# Patient Record
Sex: Female | Born: 1940 | Race: White | Hispanic: No | Smoking: Former smoker
Health system: Southern US, Community
[De-identification: ages and names within clinical notes are randomized; demographics above are authoritative.]

## PROBLEM LIST (undated history)

## (undated) DIAGNOSIS — M1990 Unspecified osteoarthritis, unspecified site: Secondary | ICD-10-CM

## (undated) DIAGNOSIS — Z5189 Encounter for other specified aftercare: Secondary | ICD-10-CM

## (undated) DIAGNOSIS — J439 Emphysema, unspecified: Secondary | ICD-10-CM

## (undated) DIAGNOSIS — N6019 Diffuse cystic mastopathy of unspecified breast: Secondary | ICD-10-CM

## (undated) DIAGNOSIS — C44310 Basal cell carcinoma of skin of unspecified parts of face: Secondary | ICD-10-CM

## (undated) DIAGNOSIS — M489 Spondylopathy, unspecified: Secondary | ICD-10-CM

## (undated) DIAGNOSIS — J449 Chronic obstructive pulmonary disease, unspecified: Secondary | ICD-10-CM

## (undated) DIAGNOSIS — R569 Unspecified convulsions: Secondary | ICD-10-CM

## (undated) DIAGNOSIS — Z720 Tobacco use: Secondary | ICD-10-CM

## (undated) DIAGNOSIS — K219 Gastro-esophageal reflux disease without esophagitis: Secondary | ICD-10-CM

## (undated) DIAGNOSIS — K2270 Barrett's esophagus without dysplasia: Secondary | ICD-10-CM

## (undated) DIAGNOSIS — I10 Essential (primary) hypertension: Secondary | ICD-10-CM

## (undated) DIAGNOSIS — E785 Hyperlipidemia, unspecified: Secondary | ICD-10-CM

## (undated) DIAGNOSIS — H269 Unspecified cataract: Secondary | ICD-10-CM

## (undated) HISTORY — PX: POLYPECTOMY: SHX149

## (undated) HISTORY — DX: Unspecified convulsions: R56.9

## (undated) HISTORY — DX: Emphysema, unspecified: J43.9

## (undated) HISTORY — PX: OTHER SURGICAL HISTORY: SHX169

## (undated) HISTORY — DX: Diffuse cystic mastopathy of unspecified breast: N60.19

## (undated) HISTORY — PX: KNEE ARTHROSCOPY: SUR90

## (undated) HISTORY — DX: Essential (primary) hypertension: I10

## (undated) HISTORY — DX: Hyperlipidemia, unspecified: E78.5

## (undated) HISTORY — DX: Unspecified cataract: H26.9

## (undated) HISTORY — DX: Unspecified osteoarthritis, unspecified site: M19.90

## (undated) HISTORY — DX: Chronic obstructive pulmonary disease, unspecified: J44.9

## (undated) HISTORY — PX: COLONOSCOPY: SHX174

## (undated) HISTORY — DX: Basal cell carcinoma of skin of unspecified parts of face: C44.310

## (undated) HISTORY — DX: Spondylopathy, unspecified: M48.9

## (undated) HISTORY — DX: Barrett's esophagus without dysplasia: K22.70

## (undated) HISTORY — DX: Tobacco use: Z72.0

## (undated) HISTORY — DX: Encounter for other specified aftercare: Z51.89

## (undated) HISTORY — DX: Gastro-esophageal reflux disease without esophagitis: K21.9

---
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
## 1965-09-08 DIAGNOSIS — Z5189 Encounter for other specified aftercare: Secondary | ICD-10-CM

## 1965-09-08 HISTORY — DX: Encounter for other specified aftercare: Z51.89

## 1969-09-08 DIAGNOSIS — N6019 Diffuse cystic mastopathy of unspecified breast: Secondary | ICD-10-CM

## 1969-09-08 HISTORY — DX: Diffuse cystic mastopathy of unspecified breast: N60.19

## 1970-09-08 HISTORY — PX: BREAST BIOPSY: SHX20

## 1998-03-16 ENCOUNTER — Other Ambulatory Visit: Admission: RE | Admit: 1998-03-16 | Discharge: 1998-03-16 | Payer: Self-pay | Admitting: Cardiology

## 1998-11-12 ENCOUNTER — Encounter: Admission: RE | Admit: 1998-11-12 | Discharge: 1998-12-07 | Payer: Self-pay | Admitting: Cardiology

## 2000-11-20 ENCOUNTER — Other Ambulatory Visit: Admission: RE | Admit: 2000-11-20 | Discharge: 2000-11-20 | Payer: Self-pay | Admitting: Internal Medicine

## 2001-07-27 ENCOUNTER — Emergency Department (HOSPITAL_COMMUNITY): Admission: EM | Admit: 2001-07-27 | Discharge: 2001-07-28 | Payer: Self-pay | Admitting: Emergency Medicine

## 2003-02-15 ENCOUNTER — Ambulatory Visit (HOSPITAL_COMMUNITY): Admission: RE | Admit: 2003-02-15 | Discharge: 2003-02-15 | Payer: Self-pay | Admitting: *Deleted

## 2003-02-15 ENCOUNTER — Encounter (INDEPENDENT_AMBULATORY_CARE_PROVIDER_SITE_OTHER): Payer: Self-pay | Admitting: Specialist

## 2003-02-15 ENCOUNTER — Encounter (INDEPENDENT_AMBULATORY_CARE_PROVIDER_SITE_OTHER): Payer: Self-pay | Admitting: *Deleted

## 2004-04-17 ENCOUNTER — Emergency Department (HOSPITAL_COMMUNITY): Admission: EM | Admit: 2004-04-17 | Discharge: 2004-04-17 | Payer: Self-pay | Admitting: *Deleted

## 2004-06-09 ENCOUNTER — Emergency Department (HOSPITAL_COMMUNITY): Admission: EM | Admit: 2004-06-09 | Discharge: 2004-06-09 | Payer: Self-pay | Admitting: Emergency Medicine

## 2005-01-15 ENCOUNTER — Encounter (INDEPENDENT_AMBULATORY_CARE_PROVIDER_SITE_OTHER): Payer: Self-pay | Admitting: *Deleted

## 2005-01-15 ENCOUNTER — Ambulatory Visit (HOSPITAL_COMMUNITY): Admission: RE | Admit: 2005-01-15 | Discharge: 2005-01-15 | Payer: Self-pay | Admitting: *Deleted

## 2005-01-15 ENCOUNTER — Encounter (INDEPENDENT_AMBULATORY_CARE_PROVIDER_SITE_OTHER): Payer: Self-pay | Admitting: Specialist

## 2005-08-05 ENCOUNTER — Inpatient Hospital Stay (HOSPITAL_COMMUNITY): Admission: EM | Admit: 2005-08-05 | Discharge: 2005-08-06 | Payer: Self-pay | Admitting: Emergency Medicine

## 2005-09-03 ENCOUNTER — Ambulatory Visit (HOSPITAL_COMMUNITY): Admission: RE | Admit: 2005-09-03 | Discharge: 2005-09-03 | Payer: Self-pay | Admitting: Internal Medicine

## 2006-04-20 ENCOUNTER — Encounter (INDEPENDENT_AMBULATORY_CARE_PROVIDER_SITE_OTHER): Payer: Self-pay | Admitting: Specialist

## 2006-04-20 ENCOUNTER — Encounter: Admission: RE | Admit: 2006-04-20 | Discharge: 2006-04-20 | Payer: Self-pay | Admitting: Internal Medicine

## 2006-04-20 ENCOUNTER — Other Ambulatory Visit: Admission: RE | Admit: 2006-04-20 | Discharge: 2006-04-20 | Payer: Self-pay | Admitting: Diagnostic Radiology

## 2007-01-12 ENCOUNTER — Ambulatory Visit (HOSPITAL_COMMUNITY): Admission: RE | Admit: 2007-01-12 | Discharge: 2007-01-12 | Payer: Self-pay | Admitting: Internal Medicine

## 2007-06-18 ENCOUNTER — Emergency Department (HOSPITAL_COMMUNITY): Admission: EM | Admit: 2007-06-18 | Discharge: 2007-06-18 | Payer: Self-pay | Admitting: Family Medicine

## 2007-12-23 ENCOUNTER — Encounter: Payer: Self-pay | Admitting: Pulmonary Disease

## 2007-12-30 ENCOUNTER — Encounter: Payer: Self-pay | Admitting: Pulmonary Disease

## 2008-07-18 ENCOUNTER — Encounter (INDEPENDENT_AMBULATORY_CARE_PROVIDER_SITE_OTHER): Payer: Self-pay | Admitting: *Deleted

## 2008-07-18 ENCOUNTER — Ambulatory Visit: Payer: Self-pay | Admitting: Pulmonary Disease

## 2008-07-18 DIAGNOSIS — J441 Chronic obstructive pulmonary disease with (acute) exacerbation: Secondary | ICD-10-CM | POA: Insufficient documentation

## 2008-08-08 ENCOUNTER — Encounter (HOSPITAL_COMMUNITY): Admission: RE | Admit: 2008-08-08 | Discharge: 2008-09-07 | Payer: Self-pay | Admitting: Pulmonary Disease

## 2008-09-08 ENCOUNTER — Encounter (HOSPITAL_COMMUNITY): Admission: RE | Admit: 2008-09-08 | Discharge: 2008-11-08 | Payer: Self-pay | Admitting: Pulmonary Disease

## 2008-10-02 ENCOUNTER — Ambulatory Visit: Payer: Self-pay | Admitting: Internal Medicine

## 2008-10-26 ENCOUNTER — Encounter: Payer: Self-pay | Admitting: Pulmonary Disease

## 2008-10-27 ENCOUNTER — Encounter: Payer: Self-pay | Admitting: Pulmonary Disease

## 2008-11-09 ENCOUNTER — Encounter (HOSPITAL_COMMUNITY): Admission: RE | Admit: 2008-11-09 | Discharge: 2009-02-07 | Payer: Self-pay | Admitting: Pulmonary Disease

## 2008-11-21 ENCOUNTER — Telehealth (INDEPENDENT_AMBULATORY_CARE_PROVIDER_SITE_OTHER): Payer: Self-pay | Admitting: *Deleted

## 2008-11-21 ENCOUNTER — Ambulatory Visit: Payer: Self-pay | Admitting: Critical Care Medicine

## 2009-01-01 ENCOUNTER — Ambulatory Visit: Payer: Self-pay | Admitting: Pulmonary Disease

## 2009-02-08 ENCOUNTER — Encounter (HOSPITAL_COMMUNITY): Admission: RE | Admit: 2009-02-08 | Discharge: 2009-05-09 | Payer: Self-pay | Admitting: Pulmonary Disease

## 2009-02-15 ENCOUNTER — Encounter: Payer: Self-pay | Admitting: Pulmonary Disease

## 2009-02-19 ENCOUNTER — Encounter: Payer: Self-pay | Admitting: Pulmonary Disease

## 2009-04-02 ENCOUNTER — Ambulatory Visit: Payer: Self-pay | Admitting: Pulmonary Disease

## 2009-04-10 ENCOUNTER — Telehealth (INDEPENDENT_AMBULATORY_CARE_PROVIDER_SITE_OTHER): Payer: Self-pay | Admitting: *Deleted

## 2009-04-11 ENCOUNTER — Ambulatory Visit: Payer: Self-pay | Admitting: Gastroenterology

## 2009-04-11 DIAGNOSIS — R1319 Other dysphagia: Secondary | ICD-10-CM

## 2009-04-11 DIAGNOSIS — I10 Essential (primary) hypertension: Secondary | ICD-10-CM | POA: Insufficient documentation

## 2009-04-11 DIAGNOSIS — K219 Gastro-esophageal reflux disease without esophagitis: Secondary | ICD-10-CM

## 2009-04-11 DIAGNOSIS — K2270 Barrett's esophagus without dysplasia: Secondary | ICD-10-CM

## 2009-04-12 ENCOUNTER — Ambulatory Visit (HOSPITAL_COMMUNITY): Admission: RE | Admit: 2009-04-12 | Discharge: 2009-04-12 | Payer: Self-pay | Admitting: Gastroenterology

## 2009-05-04 DIAGNOSIS — K449 Diaphragmatic hernia without obstruction or gangrene: Secondary | ICD-10-CM | POA: Insufficient documentation

## 2009-05-08 ENCOUNTER — Ambulatory Visit: Payer: Self-pay | Admitting: Gastroenterology

## 2009-05-10 ENCOUNTER — Encounter (HOSPITAL_COMMUNITY): Admission: RE | Admit: 2009-05-10 | Discharge: 2009-08-08 | Payer: Self-pay | Admitting: Pulmonary Disease

## 2009-06-04 ENCOUNTER — Ambulatory Visit: Payer: Self-pay | Admitting: Pulmonary Disease

## 2009-06-06 ENCOUNTER — Ambulatory Visit: Payer: Self-pay | Admitting: Pulmonary Disease

## 2009-06-12 ENCOUNTER — Encounter (INDEPENDENT_AMBULATORY_CARE_PROVIDER_SITE_OTHER): Payer: Self-pay | Admitting: *Deleted

## 2009-08-09 ENCOUNTER — Encounter (HOSPITAL_COMMUNITY): Admission: RE | Admit: 2009-08-09 | Discharge: 2009-11-07 | Payer: Self-pay | Admitting: Pulmonary Disease

## 2009-10-25 ENCOUNTER — Encounter: Payer: Self-pay | Admitting: Pulmonary Disease

## 2009-10-29 ENCOUNTER — Encounter: Payer: Self-pay | Admitting: Pulmonary Disease

## 2009-11-08 ENCOUNTER — Encounter (HOSPITAL_COMMUNITY): Admission: RE | Admit: 2009-11-08 | Discharge: 2010-02-06 | Payer: Self-pay | Admitting: Pulmonary Disease

## 2009-11-27 ENCOUNTER — Encounter: Payer: Self-pay | Admitting: Pulmonary Disease

## 2009-12-03 ENCOUNTER — Ambulatory Visit: Payer: Self-pay | Admitting: Pulmonary Disease

## 2010-01-08 ENCOUNTER — Telehealth: Payer: Self-pay | Admitting: Gastroenterology

## 2010-01-18 ENCOUNTER — Ambulatory Visit: Payer: Self-pay | Admitting: Gastroenterology

## 2010-01-18 LAB — CONVERTED CEMR LAB
Ferritin: 46 ng/mL (ref 10.0–291.0)
Folate: 19.3 ng/mL
Iron: 57 ug/dL (ref 42–145)
Magnesium: 2 mg/dL (ref 1.5–2.5)
Saturation Ratios: 21.7 % (ref 20.0–50.0)
Transferrin: 187.2 mg/dL — ABNORMAL LOW (ref 212.0–360.0)
Vitamin B-12: 551 pg/mL (ref 211–911)

## 2010-02-07 ENCOUNTER — Encounter (HOSPITAL_COMMUNITY): Admission: RE | Admit: 2010-02-07 | Discharge: 2010-05-08 | Payer: Self-pay | Admitting: Pulmonary Disease

## 2010-02-08 ENCOUNTER — Telehealth: Payer: Self-pay | Admitting: Gastroenterology

## 2010-03-28 ENCOUNTER — Encounter: Payer: Self-pay | Admitting: Pulmonary Disease

## 2010-05-09 ENCOUNTER — Encounter: Payer: Self-pay | Admitting: Pulmonary Disease

## 2010-05-23 ENCOUNTER — Ambulatory Visit: Payer: Self-pay | Admitting: Pulmonary Disease

## 2010-05-31 ENCOUNTER — Ambulatory Visit: Payer: Self-pay | Admitting: Pulmonary Disease

## 2010-10-10 NOTE — Progress Notes (Signed)
Summary: Schedule Office Visit  Phone Note Outgoing Call Call back at [facility] Phone [phone]   Call placed by: Harlow Mares CMA Duncan Dull),  Jan 08, 2010 9:03 AM Call placed to: Insurer Summary of Call: Left message on patients machine to call back. pt needs office visit . Initial call taken by: Harlow Mares CMA Duncan Dull),  Jan 08, 2010 9:04 AM  Follow-up for Phone Call        Pt has appt sch on 01/18/10 Follow-up by: Ashok Cordia RN,  Jan 16, 2010 2:56 PM

## 2010-10-10 NOTE — Assessment & Plan Note (Signed)
Summary: f/u--ch.   History of Present Illness Visit Type: Follow-up Visit Primary GI MD: Sheryn Bison MD FACP FAGA Primary Provider: Rodrigo Ran, MD Requesting Provider: na Chief Complaint: F/u for Barrett's esophagus and GERD. Pt states that feels better and denies any GI complaints  History of Present Illness:   70 year old Caucasian female with chronic gastroesophageal reflux disease who is a previous patient of Dr. Wende Bushy has chronic reflux and short segment Barrett's mucosa, currently asymptomatic on Nexium 40 mg before supper. She denies any reflux symptoms, dysphagia, hepatobiliary or lower gastrointestinal symptomatology. She is on a variety of medications listed and reviewed her record today. She also has multiple drug allergies.   GI Review of Systems      Denies abdominal pain, acid reflux, belching, bloating, chest pain, dysphagia with liquids, dysphagia with solids, heartburn, loss of appetite, nausea, vomiting, vomiting blood, weight loss, and  weight gain.        Denies anal fissure, black tarry stools, change in bowel habit, constipation, diarrhea, diverticulosis, fecal incontinence, heme positive stool, hemorrhoids, irritable bowel syndrome, jaundice, light color stool, liver problems, rectal bleeding, and  rectal pain.    Current Medications (verified): 1)  Spiriva Handihaler 18 Mcg Caps (Tiotropium Bromide Monohydrate) .... One Puff Once Daily 2)  Proair Hfa 108 (90 Base) Mcg/act Aers (Albuterol Sulfate) .... Two Puffs Qid As Needed 3)  Dilantin 100 Mg Caps (Phenytoin Sodium Extended) .[name] Kitchen.. 1 Tablet Three Times A Day By Mouth 4)  Micardis 40 Mg Tabs (Telmisartan) .[name] Kitchen.. 1 By Mouth Daily 5)  Tylenol Go Tabs Extra Strength 500 Mg Chew (Acetaminophen) .... As Needed 6)  Fish Oil  Oil (Fish Oil) .... Once Daily 7)  Lipitor 10 Mg Tabs (Atorvastatin Calcium) .... Once Daily 8)  Mucinex Dm Maximum Strength 60-1200 Mg Xr12h-Tab (Dextromethorphan-Guaifenesin) .... Once  Daily As Needed 9)  Vitamin D-1000 Max St [phone] Mg-Unit Tabs (Calcium-Vitamin D) .... Once Daily 10)  Nexium 40 Mg Cpdr (Esomeprazole Magnesium) .... Take One By Mouth Once Daily 11)  Vitamin B-12 1000 Mcg Tabs (Cyanocobalamin) .... One Tablet By Mouth Once Daily  Allergies (verified): 1)  ! Sulfa 2)  ! Phenobarbital 3)  ! Tegretol 4)  ! Dilantin 5)  ! * Altace  Past History:  Past medical, surgical, family and social histories (including risk factors) reviewed for relevance to current acute and chronic problems.  Past Medical History: Reviewed history from 06/06/2009 and no changes required. GERD, Barretts Esophagus Dr. Sabino Gasser Hemorrhoids Idiopathic nocturnal seizure, last seizure 1988 HTN, Dr. 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 Tonsillectomy C section x 3  Breast Biopsy Bilateral   Family History: Reviewed history from 04/11/2009 and no changes required. Lung Cancer Mother Larynx Cancer Father No FH of Colon Cancer:  Social History: Reviewed history from 04/11/2009 and no changes required. 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  [place]   Married with 2 children Quit smoking March, 2010.  Smoked upto 1 pack per day. Alcohol Use - no Illicit Drug Use - no  Review of Systems       The patient complains of arthritis/joint pain and cough.  The patient denies allergy/sinus, anemia, anxiety-new, back pain, blood in  urine, breast changes/lumps, change in vision, confusion, coughing up blood, depression-new, fainting, fatigue, fever, headaches-new, hearing problems, heart murmur, heart rhythm changes, itching, menstrual pain, muscle pains/cramps, night sweats, nosebleeds,  pregnancy symptoms, shortness of breath, skin rash, sleeping problems, sore throat, swelling of feet/legs, swollen lymph glands, thirst - excessive , urination - excessive , urination changes/pain, urine leakage, vision changes, and voice change.    Vital Signs:  Patient profile:   70 year old female Height:      64 inches Weight:      213 pounds BMI:     36.69 BSA:     2.01 Pulse rate:   96 / minute Pulse rhythm:   regular BP sitting:   124 / 76  (left arm) Cuff size:   regular  Vitals Entered By: Ok Anis CMA (Jan 18, 2010 9:49 AM)  Physical Exam  General:  Well developed, well nourished, no acute distress.healthy appearing.   Head:  Normocephalic and atraumatic. Eyes:  PERRLA, no icterus.exam deferred to patient's ophthalmologist.   Neck:  Supple; no masses or thyromegaly. Lungs:  Clear throughout to auscultation. Heart:  Regular rate and rhythm; no murmurs, rubs,  or bruits. Abdomen:  Soft, nontender and nondistended. No masses, hepatosplenomegaly or hernias noted. Normal bowel sounds.obese.   Extremities:  No clubbing, cyanosis, edema or deformities noted. Neurologic:  Alert and  oriented x4;  grossly normal neurologically. Cervical Nodes:  No significant cervical adenopathy. Psych:  Alert and cooperative. Normal mood and affect.   Impression & Recommendations:  Problem # 1:  GERD (ICD-530.81) Assessment Improved Continue reflect edema Nexium 40 mg before supper in the evening. She is up-to-date on her endoscopy and colonoscopy exams apparently, But Again, we have requested medical records from [facility]. These records were requested in August and not forwarded to [place]. Screening labs requested because of chronic PPI use. Orders: TLB-B12, Serum-Total ONLY (16109-U04) TLB-Ferritin (82728-FER) TLB-Folic Acid (Folate) (82746-FOL) TLB-IBC Pnl (Iron/FE;Transferrin) (83550-IBC) TLB-Magnesium (Mg) (83735-MG)  Problem # 2:  HYPERTENSION  (ICD-401.9) Assessment: Improved blood pressure today normal at 124/76. She is to continue all other medications as per Dr. Waynard Edwards.  Patient Instructions: 1)  Please go to the basement for lab work. 2)  Please continue current medications.  3)  The medication list was reviewed and reconciled.  All changed / newly prescribed medications were explained.  A complete medication list was provided to the patient / caregiver. 4)  Copy sent to : Dr[facility] Rodrigo Ran at [facility]. 5)  Avoid foods high in acid content ( tomatoes, citrus juices, spicy foods) . Avoid eating within 3 to 4 hours of lying down or before exercising. Do not over eat; try smaller more frequent meals. Elevate head of bed four inches when sleeping.  6)  Please schedule a follow-up appointment as needed.

## 2010-10-10 NOTE — Assessment & Plan Note (Signed)
Summary: f/u ///kp   Visit Type:  Follow-up Copy to:  na Primary Sherril Heyward/Referring Mayco Walrond:  Rodrigo Ran, MD  CC:  COPD...the patient c/o increased SOB and prod cough x4 weeks...mucus is gray in color.  History of Present Illness: 70 yo WF with known hx of  COPD.  She was recently treated for a bronchitis.  She was given augmentin and prednisone.  She has improved, but not fully recovered.  She still has a cough with thick, gray sputum.  She is not having fever, and denies hemoptysis.  She is wheezing some, but this is getting better.  She has not been using her proair much.  It helps when she uses it.   Current Medications (verified): 1)  Spiriva Handihaler 18 Mcg Caps (Tiotropium Bromide Monohydrate) .... One Puff Once Daily 2)  Proair Hfa 108 (90 Base) Mcg/act Aers (Albuterol Sulfate) .... Two Puffs Qid As Needed 3)  Dilantin 100 Mg Caps (Phenytoin Sodium Extended) .[name] Kitchen.. 1 Tablet Three Times A Day By Mouth 4)  Micardis 40 Mg Tabs (Telmisartan) .[name] Kitchen.. 1 By Mouth Daily 5)  Tylenol Go Tabs Extra Strength 500 Mg Chew (Acetaminophen) .... As Needed 6)  Fish Oil 1000 Mg Caps (Omega-3 Fatty Acids) .... Take 1 Capsule By Mouth Once A Day 7)  Lipitor 20 Mg Tabs (Atorvastatin Calcium) .... Take 1 Tab By Mouth At Bedtime 8)  Mucinex Dm Maximum Strength 60-1200 Mg Xr12h-Tab (Dextromethorphan-Guaifenesin) .... Once Daily As Needed 9)  Vitamin D-1000 Max St [id number] Mg-Unit Tabs (Calcium-Vitamin D) .... Once Daily 10)  Nexium 40 Mg Cpdr (Esomeprazole Magnesium) .... Take One By Mouth Once Daily 11)  Vitamin B-12 1000 Mcg Tabs (Cyanocobalamin) .... One Tablet By Mouth Once Daily  Allergies (verified): 1)  ! Sulfa 2)  ! Phenobarbital 3)  ! Tegretol 4)  ! Dilantin 5)  ! * Altace  Past History:  Past Medical History: Reviewed history from 06/06/2009 and no changes required. GERD, Barretts Esophagus Dr. Sabino Gasser Hemorrhoids Idiopathic nocturnal seizure, last seizure 1988 HTN, Dr. 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 Tonsillectomy C section x 3  Breast Biopsy Bilateral   Review of Systems       The patient complains of shortness of breath with activity, productive cough, non-productive cough, and sore throat.  The patient denies shortness of breath at rest, coughing up blood, chest pain, abdominal pain, difficulty swallowing, headaches, nasal congestion/difficulty breathing through nose, hand/feet swelling, and fever.    Vital Signs:  Patient profile:   70 year old female Height:      64 inches (162.56 cm) Weight:      216 pounds (98.18 kg) BMI:     37.21 O2 Sat:      93 % on Room air Temp:     98.4 degrees F (36.89 degrees C) oral Pulse rate:   80 / minute BP sitting:   132 / 70  (left arm) Cuff size:   regular  Vitals Entered By: Michel Bickers CMA (May 31, 2010 4:52 PM)  O2 Sat at Rest %:  93 O2 Flow:  Room air CC: COPD...the patient c/o increased SOB and prod cough x4 weeks...mucus is gray in color Comments Medications reviewed with patient  Daytime phone verified. Michel Bickers [facility]  May 31, 2010 4:52 PM   Physical Exam  General:  normal appearance and obese.   Nose:  no sinus tenderness Mouth:  no oral lesions, triangular uvula Neck:  no masses, thyromegaly, or abnormal cervical nodes Lungs:  no wheezing or rales, decreased breath sounds, no dullness Heart:  regular rate and rhythm, S1, S2 without murmurs, rubs, gallops, or clicks Extremities:  no edema Neurologic:  normal CN II-XII and strength normal.   Cervical Nodes:  no significant adenopathy   Impression & Recommendations:  Problem # 1:  ACUTE BRONCHITIS (ICD-466.0)  She has slow to resolve  bronchitis.  I don't think she needs additional antibiotics or prednisone at this time.  Will have her use symbicort two puffs two times a day for two weeks, and then resume her previous regimen of spiriva and as needed proair.  Advised her that she can use  proair more often if needed.  Problem # 2:  COPD (ICD-496) She will need to get a flu shot.  Advised her to fully recover from her bronchitis before getting this.  Complete Medication List: 1)  Spiriva Handihaler 18 Mcg Caps (Tiotropium bromide monohydrate) .... One puff once daily 2)  Proair Hfa 108 (90 Base) Mcg/act Aers (Albuterol sulfate) .... Two puffs qid as needed 3)  Dilantin 100 Mg Caps (Phenytoin sodium extended) .[name] Kitchen.. 1 tablet three times a day by mouth 4)  Micardis 40 Mg Tabs (Telmisartan) .[name] Kitchen.. 1 by mouth daily 5)  Tylenol Go Tabs Extra Strength 500 Mg Chew (Acetaminophen) .... As needed 6)  Fish Oil 1000 Mg Caps (Omega-3 fatty acids) .... Take 1 capsule by mouth once a day 7)  Lipitor 20 Mg Tabs (Atorvastatin calcium) .... Take 1 tab by mouth at bedtime 8)  Mucinex Dm Maximum Strength 60-1200 Mg Xr12h-tab (Dextromethorphan-guaifenesin) .... Once daily as needed 9)  Vitamin D-1000 Max St [id number] Mg-unit Tabs (Calcium-vitamin d) .... Once daily 10)  Nexium 40 Mg Cpdr (Esomeprazole magnesium) .... Take one by mouth once daily 11)  Vitamin B-12 1000 Mcg Tabs (Cyanocobalamin) .... One tablet by mouth once daily  Other Orders: Est. Patient Level IV (52841)  Patient Instructions: 1)  Symbicort two puffs two times a day for 2 weeks 2)  Follow up in 4 months

## 2010-10-10 NOTE — Miscellaneous (Signed)
[facility]   Imported By: Lester [place] 11/30/2009 08:29:42  _____________________________________________________________________  External Attachment:    Type:   Image     Comment:   External Document

## 2010-10-10 NOTE — Procedures (Signed)
Summary: [facility]  [facility]   Imported By: Lester [place] 11/13/2009 10:27:04  _____________________________________________________________________  External Attachment:    Type:   Image     Comment:   External Document

## 2010-10-10 NOTE — Procedures (Signed)
Summary: Colonoscopy   Colonoscopy  Procedure date:  02/15/2003  Findings:      Location:  [facility].    NAME:  Meredith Marshall, Meredith Marshall                           ACCOUNT NO.:  [id number]   MEDICAL RECORD NO.:  [id number]                   PATIENT TYPE:  AMB   LOCATION:  ENDO                                 FACILITY:  [facility]   PHYSICIAN:  Georgiana Spinner, M.D.                 DATE OF BIRTH:  Jun 25, 1941   DATE OF PROCEDURE:  DATE OF DISCHARGE:                                 OPERATIVE REPORT   PROCEDURE:  Colonoscopy.   INDICATIONS FOR PROCEDURE:  Hemoccult positivity, colon cancer screening.   ANESTHESIA:  Demerol 20, Versed 2 mg.   DESCRIPTION OF PROCEDURE:  With the patient mildly sedated in the left  lateral decubitus position, a rectal examination was performed which  revealed trace positive material. Subsequently the Olympus videoscopic  colonoscope was inserted in the rectum and passed under direct vision to the  cecum identified by the ileocecal valve and base of the cecum both of which  were photographed. From this point, the colonoscope was slowly withdrawn  taking circumferential views of the entire colonic mucosa stopping only in  the rectum which appeared normal on direct and showed small internal  hemorrhoids on retroflexed view. The endoscope was straightened and  withdrawn. The patient's vital signs and pulse oximeter remained stable. The  patient tolerated the procedure well without apparent complications.   FINDINGS:  Internal hemorrhoids, otherwise, unremarkable colonoscopic  examination to the cecum.   PLAN:  Repeat examination in 5-10 years.                                               Georgiana Spinner, M.D.    GMO/MEDQ  D:  02/15/2003  T:  02/15/2003  Job:  [id number]

## 2010-10-10 NOTE — Assessment & Plan Note (Signed)
Summary: Acute NP office visit - bronchitis   Copy to:  na Primary Provider/Referring Provider:  Rodrigo Ran, MD  CC:  prod cough with yellow/white, increased SOB, and wheezing x3weeks - denies f/c/s.  History of Present Illness: 70 yo Marshall with known hx of  COPD.  May 23, 2010 --Presents for an acute office visit. Complains of prod cough with yellow/white, increased SOB, wheezing x3weeks. OTC not working. Has been doing well until last few weeks. Denies chest pain,  orthopnea, hemoptysis, fever, n/v/d, edema, headache,recent travel or antibiotics. Has finished pulmonary rehab.    Medications Prior to Update: 1)  Spiriva Handihaler 18 Mcg Caps (Tiotropium Bromide Monohydrate) .... One Puff Once Daily 2)  Proair Hfa 108 (90 Base) Mcg/act Aers (Albuterol Sulfate) .... Two Puffs Qid As Needed 3)  Dilantin 100 Mg Caps (Phenytoin Sodium Extended) .[name] Kitchen.. 1 Tablet Three Times A Day By Mouth 4)  Micardis 40 Mg Tabs (Telmisartan) .[name] Kitchen.. 1 By Mouth Daily 5)  Tylenol Go Tabs Extra Strength 500 Mg Chew (Acetaminophen) .... As Needed 6)  Fish Oil  Oil (Fish Oil) .... Once Daily 7)  Lipitor 10 Mg Tabs (Atorvastatin Calcium) .... Once Daily 8)  Mucinex Dm Maximum Strength 60-1200 Mg Xr12h-Tab (Dextromethorphan-Guaifenesin) .... Once Daily As Needed 9)  Vitamin D-1000 Max St [phone] Mg-Unit Tabs (Calcium-Vitamin D) .... Once Daily 10)  Nexium 40 Mg Cpdr (Esomeprazole Magnesium) .... Take One By Mouth Once Daily 11)  Vitamin B-12 1000 Mcg Tabs (Cyanocobalamin) .... One Tablet By Mouth Once Daily  Current Medications (verified): 1)  Spiriva Handihaler 18 Mcg Caps (Tiotropium Bromide Monohydrate) .... One Puff Once Daily 2)  Proair Hfa 108 (90 Base) Mcg/act Aers (Albuterol Sulfate) .... Two Puffs Qid As Needed 3)  Dilantin 100 Mg Caps (Phenytoin Sodium Extended) .[name] Kitchen.. 1 Tablet Three Times A Day By Mouth 4)  Micardis 40 Mg Tabs (Telmisartan) .[name] Kitchen.. 1 By Mouth Daily 5)  Tylenol Go Tabs Extra Strength 500 Mg  Chew (Acetaminophen) .... As Needed 6)  Fish Oil 1000 Mg Caps (Omega-3 Fatty Acids) .... Take 1 Capsule By Mouth Once A Day 7)  Lipitor 20 Mg Tabs (Atorvastatin Calcium) .... Take 1 Tab By Mouth At Bedtime 8)  Mucinex Dm Maximum Strength 60-1200 Mg Xr12h-Tab (Dextromethorphan-Guaifenesin) .... Once Daily As Needed 9)  Vitamin D-1000 Max St [phone] Mg-Unit Tabs (Calcium-Vitamin D) .... Once Daily 10)  Nexium 40 Mg Cpdr (Esomeprazole Magnesium) .... Take One By Mouth Once Daily 11)  Vitamin B-12 1000 Mcg Tabs (Cyanocobalamin) .... One Tablet By Mouth Once Daily  Allergies: 1)  ! Sulfa 2)  ! Phenobarbital 3)  ! Tegretol 4)  ! Dilantin 5)  ! * Altace  Past History:  Past Medical History: Last updated: 06/06/2009 GERD, Barretts Esophagus Dr. Sabino Gasser Hemorrhoids Idiopathic nocturnal seizure, last seizure 1988 HTN, Dr. 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  [place]   Married with 2 children Quit smoking March, 2010.  Smoked upto 1 pack per day. Alcohol Use - no  Illicit Drug Use - no  Risk Factors: Smoking Status: quit (01/01/2009) Packs/Day: 0.25 (11/21/2008)  Review of Systems      See HPI  Vital Signs:  Patient profile:   70 year old female Height:      64 inches Weight:      214.38 pounds BMI:     36.93 O2 Sat:      96 % on Room air Temp:     100.7 degrees F oral Pulse rate:   102 / minute BP sitting:   164 / 84  (right arm) Cuff size:   regular  Vitals  Entered By: Boone Master CNA/MA (May 23, 2010 2:47 PM)  O2 Flow:  Room air CC: prod cough with yellow/white, increased SOB, wheezing x3weeks - denies f/c/s Is Patient Diabetic? No Comments Medications reviewed with patient Daytime contact number verified with patient. Boone Master CNA/MA  May 23, 2010 2:48 PM    Physical Exam  Additional Exam:  Gen: Pleasant, well-nourished, in no distress , normal affect ENT: no lesions, no post nasal drip Neck: No JVD, no TMG, no carotid bruits Lungs: No use of accessory muscles, no dullness to percussion, inspir and expir wheeze and scattered rhonchi Cardiovascular: RRR, heart sounds normal, no murmurs or gallops, no peripheral edema Abdomen: soft and non-tender, no HSM, BS normal Musculoskeletal: No deformities, no cyanosis or clubbing Neuro: alert, non-focal     Impression & Recommendations:  Problem # 1:  COPD (ICD-496) Exacerbation  Plan:  xopenex neb  Augmentin 875mg  two times a day for 7 days Mucinex DM two times a day as needed cough/congestion Prednisone taper over next week.  follow up next week .Dr. Craige Cotta.  Please contact office for sooner follow up if symptoms do not improve or worsen   Medications Added to Medication List This Visit: 1)  Fish Oil 1000 Mg Caps (Omega-3 fatty acids) .... Take 1 capsule by mouth once a day 2)  Lipitor 20 Mg Tabs (Atorvastatin calcium) .... Take 1 tab by mouth at bedtime 3)  Prednisone 10 Mg Tabs (Prednisone) .... 4 tabs for 2 days, then 3 tabs for 2 days, 2 tabs for 2 days, then 1 tab for 2 days, then stop 4)  Augmentin 875-125 Mg Tabs (Amoxicillin-pot clavulanate) .[name] Kitchen.. 1 by mouth two times a day  Complete Medication List: 1)  Spiriva Handihaler 18 Mcg Caps (Tiotropium bromide monohydrate) .... One puff once daily 2)  Proair Hfa 108 (90 Base) Mcg/act Aers (Albuterol sulfate) .... Two puffs qid as needed 3)  Dilantin 100 Mg Caps (Phenytoin sodium extended) .[name] Kitchen.. 1 tablet three times  a day by mouth 4)  Micardis 40 Mg Tabs (Telmisartan) .[name] Kitchen.. 1 by mouth daily 5)  Tylenol Go Tabs Extra Strength 500 Mg Chew (Acetaminophen) .... As needed 6)  Fish Oil 1000 Mg Caps (Omega-3 fatty acids) .... Take 1 capsule by mouth once a day 7)  Lipitor 20 Mg Tabs (Atorvastatin calcium) .... Take 1 tab by mouth at bedtime 8)  Mucinex Dm Maximum Strength 60-1200 Mg Xr12h-tab (Dextromethorphan-guaifenesin) .... Once daily as needed 9)  Vitamin D-1000 Max St [phone] Mg-unit Tabs (Calcium-vitamin d) .... Once daily 10)  Nexium 40 Mg Cpdr (Esomeprazole magnesium) .... Take one by mouth once daily 11)  Vitamin B-12 1000 Mcg Tabs (Cyanocobalamin) .... One tablet by mouth once daily 12)  Prednisone 10 Mg Tabs (Prednisone) .... 4 tabs for 2 days, then 3 tabs for 2 days, 2 tabs for 2 days, then 1 tab for 2  days, then stop 13)  Augmentin 875-125 Mg Tabs (Amoxicillin-pot clavulanate) .[name] Kitchen.. 1 by mouth two times a day  Other Orders: Est. Patient Level IV (16109)  Patient Instructions: 1)  Augmentin 875mg  two times a day for 7 days 2)  Mucinex DM two times a day as needed cough/congestion 3)  Prednisone taper over next week.  4)  follow up next week .Dr. Craige Cotta.  5)  Please contact office for sooner follow up if symptoms do not improve or worsen  Prescriptions: AUGMENTIN 875-125 MG TABS (AMOXICILLIN-POT CLAVULANATE) 1 by mouth two times a day  #14 x 0   Entered and Authorized by:   Rubye Oaks NP   Signed by:   Tammy Parrett NP on 05/23/2010   Method used:   Electronically to        [facility]  [address] [phone]* (retail)       [address]       [place], [place]  [address]       Ph: [phone] or [phone]       Fax: [phone]   RxID:   [id number] PREDNISONE 10 MG TABS (PREDNISONE) 4 tabs for 2 days, then 3 tabs for 2 days, 2 tabs for 2 days, then 1 tab for 2 days, then stop  #20 x 0   Entered and Authorized by:   Rubye Oaks NP   Signed by:   Tammy Parrett NP on 05/23/2010    Method used:   Electronically to        [facility] [address] [phone]* (retail)       [address]       [place], [place]  [address]       Ph: [phone] or [phone]       Fax: [phone]   RxID:   [id number]    Immunization History:  Pneumovax Immunization History:    Pneumovax:  historical (09/08/2008)   Appended Document: Orders Update - neb treatment    Clinical Lists Changes  Orders: Added new Service order of Nebulizer Tx (23762) - Signed

## 2010-10-10 NOTE — Letter (Signed)
Summary: Attendance  Attendance   Imported By: Lester [place] 05/20/2010 08:09:14  _____________________________________________________________________  External Attachment:    Type:   Image     Comment:   External Document

## 2010-10-10 NOTE — Procedures (Signed)
Summary: EGD & Pathology   EGD  Procedure date:  01/15/2005  Findings:      Location: [facility]   NAME:  Meredith Marshall, Meredith Marshall                 ACCOUNT NO.:  [id number]   MEDICAL RECORD NO.:  [id number]          PATIENT TYPE:  AMB   LOCATION:  ENDO                         FACILITY:  [facility]   PHYSICIAN:  Georgiana Spinner, M.D.    DATE OF BIRTH:  08-31-1941   DATE OF PROCEDURE:  01/15/2005  DATE OF DISCHARGE:                                 OPERATIVE REPORT   PROCEDURE:  Upper endoscopy.   ENDOSCOPIST:  Georgiana Spinner, M.D.   INDICATIONS:  GERD.  Rule out Barrett's esophagus.   ANESTHESIA:  Demerol 60, Versed 7 milligrams.   DESCRIPTION OF PROCEDURE:  With the patient mildly sedated in the left  lateral decubitus position, the Olympus videoscopic endoscope was inserted  in the mouth, passed under direct vision through the esophagus which  appeared normal. Distal esophagus was approached and appeared mildly  inflamed.  I could not see a definite area of Barrett's at this point.  Part  of it is because of the anatomy at this time, but we photographed this area  and biopsied the erythematous changes.  I advanced into the stomach.  The  fundus, body, antrum, duodenal bulb, second portion of the duodenum were  visualized.  From this point, the endoscope was slowly withdrawn taking  circumferential views of duodenal mucosa until the endoscope had been pulled  back into the stomach placed in retroflexion to view the stomach from below  and we could see the esophagus through a loose wrap of the GE junction  around the endoscope.  This was photographed and we did biopsy this area in  retroflexed view.  The endoscope was straightened and withdrawn.  The  patient's vital signs, pulse oximeter remained stable.  The patient  tolerated procedure well without apparent complications.   FINDINGS:  Changes of mildly inflamed distal esophagus biopsied.   PLAN:  Await biopsy report. The patient  will call me for results and follow-  up with me as an outpatient      GMO/MEDQ  D:  01/15/2005  T:  01/15/2005  Job:  [id number]   cc:   Loraine Leriche A. Waynard Edwards, M.D.  [address]  [place]  [place] [address]  Fax: [phone]   SP Surgical Pathology - STATUS: Final             By: Morrie Sheldon,       Perform Date: [id number] 00:00  Ordered By: Jarvis Newcomer,            Ordered Date: [id number] 11:34  Facility: [facility]                              Department: CPATH  Service Report Text  [facility]   [address] [place], [place] [address]   [phone]    REPORT OF SURGICAL PATHOLOGY    Case #:  [id number]   Patient Name: Meredith Marshall, Meredith Marshall   PID: [id number]   Pathologist: Beulah Gandy. Luisa Hart, MD   DOB/Age June 12, 1941 (Age: 70) Gender: F   Date Taken: 01/15/2005   Date Received: 01/15/2005    FINAL DIAGNOSIS    ***MICROSCOPIC EXAMINATION AND DIAGNOSIS***    ESOPHAGUS, DISTAL, BIOPSIES: GASTROESOPHAGEAL JUNCTION MUCOSA   WITH INFLAMMATION CONSISTENT WITH REFLUX AND A RARE FOCUS OF   GOBLET CELL METAPLASIA. SEE COMMENT. NO DYSPLASIA OR CARCINOMA   IDENTIFIED.    COMMENT   The biopsy fragments show gastroesophageal junction mucosa with   inflammation and changes consistent with reflux. Alcian blue   stain is performed and there is a rare focus of Alcian blue   positivity consistent with focal intestinal goblet cell   metaplasia. This is in the setting of inflammation and reflux   related changes. The controls stained appropriately. No   dysplasia or carcinoma is identified. (JP:jy) 01/16/05    jy   Date Reported: 01/16/2005 Beulah Gandy. Luisa Hart, MD   *** Electronically Signed Out By JDP ***    Clinical information   R/O Barrett' s. (hd)    specimen(s) obtained   Esophagus, biopsy, distal    Gross Description   Received in formalin are tan, soft tissue fragments that are   submitted in toto. Number: multiple   Size: 0.4 x 0.3 x 0.2 cm (SSW:kcv 01-15-05)     kv/

## 2010-10-10 NOTE — Miscellaneous (Signed)
Summary: [facility]  [facility]   Imported By: Lester [place] 11/13/2009 10:25:19  _____________________________________________________________________  External Attachment:    Type:   Image     Comment:   External Document

## 2010-10-10 NOTE — Procedures (Signed)
Summary: EGD & Pathology   EGD  Procedure date:  02/15/2003  Findings:      Location: [facility]   NAME:  Meredith Marshall, Meredith Marshall                           ACCOUNT NO.:  [id number]   MEDICAL RECORD NO.:  [id number]                   PATIENT TYPE:  AMB   LOCATION:  ENDO                                 FACILITY:  [facility]   PHYSICIAN:  Georgiana Spinner, M.D.                 DATE OF BIRTH:  1941-02-28   DATE OF PROCEDURE:  DATE OF DISCHARGE:                                 OPERATIVE REPORT   PROCEDURE:  Upper endoscopy.   INDICATIONS FOR PROCEDURE:  Gastroesophageal reflux disease.   ANESTHESIA:  Demerol 50, Versed 6 mg.   DESCRIPTION OF PROCEDURE:  With the patient mildly sedated in the left  lateral decubitus position, the Olympus videoscopic endoscope was inserted  in the mouth and passed under direct vision through the esophagus which  appeared normal except for changes of what appeared to be Barrett's  esophagus with islands of reddish tissue that were seen, photographed and  biopsied. We entered into the stomach. The fundus, body, antrum, duodenal  bulb and second portion of the duodenum were visualized. From this point,  the endoscope was slowly withdrawn taking circumferential views of the  duodenal mucosa until the endoscope was pulled back into the stomach, placed  in retroflexion to view the stomach from below. The endoscope was then  straightened and withdrawn taking circumferential views of the remaining  gastric and esophageal mucosa. The patient's vital signs and pulse oximeter  remained stable. The patient tolerated the procedure well without apparent  complications.   FINDINGS:  Changes of possible Barrett's esophagus biopsied. Await biopsy  report. The patient will call me for results and followup with me as an  outpatient. Proceed to colonoscopy as planned.                                               Georgiana Spinner, M.D.    GMO/MEDQ  D:  02/15/2003  T:   02/15/2003  Job:  [id number]   SP Surgical Pathology - STATUS: Final             By: Morrie Sheldon,       Perform Date: 9 Jun04 00:00  Ordered By: Jarvis Newcomer,            Ordered Date: 9 Jun04 15:37  Facility: [facility]                              Department: CPATH  Service Report Text  [facility]   [address] [place], [place] [address]   [phone]    REPORT OF SURGICAL PATHOLOGY    Case #: [id number]   Patient Name: Meredith Marshall, Meredith Marshall   PID: [id number]   Pathologist: Beulah Gandy. Luisa Hart, MD   DOB/Age 02/02/1941 (Age: 18) Gender: F   Date Taken: 02/15/2003   Date Received: 02/15/2003    FINAL DIAGNOSIS    ***MICROSCOPIC EXAMINATION AND DIAGNOSIS***    ESOPHAGUS, BIOPSIES: GASTROESOPHAGEAL JUNCTION MUCOSA WITH MILD   INFLAMMATION CONSISTENT WITH GASTROESOPHAGEAL REFLUX. NO   INTESTINAL METAPLASIA, DYSPLASIA OR MALIGNANCY IDENTIFIED.    COMMENT   An Alcian Blue stain is performed to determine the presence of   intestinal metaplasia. No intestinal metaplasia is identified   with the Alcian Blue stain. The control stained appropriately.   (JDP:caf 02/16/03)    cf   Date Reported: 02/16/2003 Beulah Gandy. Luisa Hart, MD   *** Electronically Signed Out By JDP ***    Clinical information   R/O Barrett' s (cf)    specimen(s) obtained   Esophagus, biopsy, distal    Gross Description   Received in formalin are tan, soft tissue fragments that are   submitted in toto. Number: 8   Size:0.1 to 0.3 cm JBM:Mw 6-9    mw/

## 2010-10-10 NOTE — Progress Notes (Signed)
Summary: Lab results  Phone Note Call from Patient Call back at Home Phone [phone]   Caller: Patient Call For: Dr. Jarold Motto Reason for Call: Lab or Test Results Summary of Call: Pt is calling about lab results Initial call taken by: Karna Christmas,  February 08, 2010 2:31 PM  Follow-up for Phone Call        Pt request copy of labs be sent to Dr. Waynard Edwards.    Follow-up by: Ashok Cordia RN,  February 08, 2010 2:55 PM  Additional Follow-up for Phone Call Additional follow up Details #1::        Done as requested. Additional Follow-up by: Ashok Cordia RN,  February 08, 2010 2:55 PM

## 2010-10-10 NOTE — Assessment & Plan Note (Signed)
Summary: 4-6 months/apc   Copy to:  Rodrigo Ran, MD Primary Provider/Referring Provider:  Rodrigo Ran, MD  CC:  COPD. The patient states she is doing well. No complaints today.Meredith Marshall  History of Present Illness: Saw Meredith Marshall in follow up for her COPD.  She has been feeling better since she restarted spiriva.  She has only needed to use her rescue inhaler twice since restarting spiriva.  She has done well with pulmonary rehab.  I thas been one year and two weeks since she stopped smoking.   Current Medications (verified): 1)  Proair Hfa 108 (90 Base) Mcg/act Aers (Albuterol Sulfate) .... Two Puffs Qid As Needed 2)  Dilantin 100 Mg Caps (Phenytoin Sodium Extended) .Meredith Marshall.. 1 Tablet Three Times A Day By Mouth 3)  Micardis 40 Mg Tabs (Telmisartan) .Meredith Marshall.. 1 By Mouth Daily 4)  Tylenol Go Tabs Extra Strength 500 Mg Chew (Acetaminophen) .... As Needed 5)  Fish Oil  Oil (Fish Oil) .... Once Daily 6)  Lipitor 10 Mg Tabs (Atorvastatin Calcium) .... Once Daily 7)  Mucinex Dm Maximum Strength 60-1200 Mg Xr12h-Tab (Dextromethorphan-Guaifenesin) .... Once Daily As Needed 8)  Vitamin D-1000 Max St [phone] Mg-Unit Tabs (Calcium-Vitamin D) .... Once Daily 9)  First-Bxn Mouthwash  Susp (Diphenhyd-Lidocaine-Nystatin) .... As Directed 10)  Nexium 40 Mg Cpdr (Esomeprazole Magnesium) .... Take One By Mouth Once Daily 11)  Spiriva Handihaler 18 Mcg Caps (Tiotropium Bromide Monohydrate) .... One Puff Once Daily  Allergies (verified): 1)  ! Sulfa 2)  ! Phenobarbital 3)  ! Tegretol 4)  ! Dilantin 5)  ! * Altace  Past History:  Past Medical History: Last updated: 06/06/2009 GERD, Barretts Esophagus Dr. Sabino Gasser Hemorrhoids Idiopathic nocturnal seizure, last seizure 1988 HTN, Dr. 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 Cystitis Thyroiditis 1989 Osteoporosis COPD      - PFT 06/04/09 FEV1 1.80(87%), FVC 2.96(103%), FEV1% 61, TLC 5.29(110%), DLCO  66%, no BD  Past Surgical History: Last updated: 04/11/2009 Tonsillectomy C section x 3  Breast Biopsy Bilateral   Vital Signs:  Patient profile:   70 year old female Height:      64 inches (162.56 cm) Weight:      216 pounds (98.18 kg) BMI:     37.21 O2 Sat:      93 % on Room air Temp:     97.7 degrees F (36.50 degrees C) oral Pulse rate:   101 / minute BP sitting:   126 / 78  (left arm) Cuff size:   regular  Vitals Entered By: Michel Bickers CMA (December 03, 2009 1:45 PM)  O2 Sat at Rest %:  93 O2 Flow:  Room air  Physical Exam  Nose:  no sinus tenderness Mouth:  no oral lesions, triangular uvula Neck:  no masses, thyromegaly, or abnormal cervical nodes Lungs:  no wheezing or rales Heart:  regular rate and rhythm, S1, S2 without murmurs, rubs, gallops, or clicks Extremities:  no edema Cervical Nodes:  no significant adenopathy   Impression & Recommendations:  Problem # 1:  COPD (ICD-496) She has done better with spiriva.  She will let us know if she needs refills.  Complete Medication List: 1)  Spiriva Handihaler 18 Mcg Caps (Tiotropium bromide monohydrate) .... One puff once daily 2)  Proair Hfa 108 (90 Base) Mcg/act Aers (Albuterol sulfate) .... Two puffs qid as needed 3)  Dilantin 100 Mg Caps (Phenytoin sodium extended) .Meredith Marshall.. 1 tablet three  times a day by mouth 4)  Micardis 40 Mg Tabs (Telmisartan) .Meredith Marshall.. 1 by mouth daily 5)  Tylenol Go Tabs Extra Strength 500 Mg Chew (Acetaminophen) .... As needed 6)  Fish Oil Oil (Fish oil) .... Once daily 7)  Lipitor 10 Mg Tabs (Atorvastatin calcium) .... Once daily 8)  Mucinex Dm Maximum Strength 60-1200 Mg Xr12h-tab (Dextromethorphan-guaifenesin) .... Once daily as needed 9)  Vitamin D-1000 Max St [phone] Mg-unit Tabs (Calcium-vitamin d) .... Once daily 10)  First-bxn Mouthwash Susp (Diphenhyd-lidocaine-nystatin) .... As directed 11)  Nexium 40 Mg Cpdr (Esomeprazole magnesium) .... Take one by mouth once daily  Other  Orders: Est. Patient Level III (04540)  Patient Instructions: 1)  Follow up in 4 to 6 months   Immunization History:  Influenza Immunization History:    Influenza:  historical (06/08/2009)  Pneumovax Immunization History:    Pneumovax:  historical (12/07/2004)

## 2010-10-10 NOTE — Miscellaneous (Signed)
Summary: Pulmonary/[place]  [facility]/[place]   Imported By: Sherian Rein 04/04/2010 08:13:00  _____________________________________________________________________  External Attachment:    Type:   Image     Comment:   External Document

## 2010-10-17 ENCOUNTER — Ambulatory Visit (INDEPENDENT_AMBULATORY_CARE_PROVIDER_SITE_OTHER): Payer: MEDICARE | Admitting: Pulmonary Disease

## 2010-10-17 ENCOUNTER — Encounter: Payer: Self-pay | Admitting: Pulmonary Disease

## 2010-10-17 DIAGNOSIS — J449 Chronic obstructive pulmonary disease, unspecified: Secondary | ICD-10-CM

## 2010-10-24 NOTE — Assessment & Plan Note (Signed)
Summary: rov/sh   Copy to:  na Primary Provider/Referring Provider:  Rodrigo Ran, MD  CC:  follow up. pt states she has had a little increased sob. Slight dry cough, wheezing, and little chest tightness. [name] Kitchen  History of Present Illness: 70 yo WF with known hx of  COPD.  She has improved since last visit.  She is not using symbicort anymore.  She is not using proair much.  She has occasional cough, but no much sputum or wheeze.  She denies sinus congestion, fever, hemoptysis, or leg swelling.  She has not been exercising much.    Current Medications (verified): 1)  Spiriva Handihaler 18 Mcg Caps (Tiotropium Bromide Monohydrate) .... One Puff Once Daily 2)  Proair Hfa 108 (90 Base) Mcg/act Aers (Albuterol Sulfate) .... Two Puffs Qid As Needed 3)  Dilantin 100 Mg Caps (Phenytoin Sodium Extended) .[name] Kitchen.. 1 Tablet Three Times A Day By Mouth 4)  Micardis 40 Mg Tabs (Telmisartan) .[name] Kitchen.. 1 By Mouth Daily 5)  Tylenol Go Tabs Extra Strength 500 Mg Chew (Acetaminophen) .... As Needed 6)  Fish Oil 1000 Mg Caps (Omega-3 Fatty Acids) .... Take 1 Capsule By Mouth Once A Day 7)  Lipitor 20 Mg Tabs (Atorvastatin Calcium) .... Take 1 Tab By Mouth At Bedtime 8)  Mucinex Dm Maximum Strength 60-1200 Mg Xr12h-Tab (Dextromethorphan-Guaifenesin) .... Once Daily As Needed 9)  Vitamin D-1000 Max St [phone] Mg-Unit Tabs (Calcium-Vitamin D) .... Once Daily 10)  Vitamin B-12 1000 Mcg Tabs (Cyanocobalamin) .... One Tablet By Mouth Once Daily  Allergies (verified): 1)  ! Sulfa 2)  ! Phenobarbital 3)  ! Tegretol 4)  ! Dilantin 5)  ! * Altace  Past History:  Past Medical History: Reviewed history from 06/06/2009 and no changes required. GERD, Barretts Esophagus Dr. Sabino Gasser Hemorrhoids Idiopathic nocturnal seizure, last seizure 1988 HTN, Dr. 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 Tonsillectomy C section x 3  Breast Biopsy Bilateral   Vital Signs:  Patient profile:   70 year old female Height:      64 inches Weight:      221.38 pounds BMI:     38.14 O2 Sat:      97 % on Room air Temp:     98 degrees F oral Pulse rate:   97 / minute BP sitting:   126 / 72  (left arm) Cuff size:   large  Vitals Entered By: Carver Fila (October 17, 2010 8:56 AM)  O2 Flow:  Room air CC: follow up. pt states she has had a little increased sob. Slight dry cough, wheezing, little chest tightness.  Comments meds and allergies updated Phone number updated Carver Fila  October 17, 2010 8:57 AM    Physical Exam  General:  normal appearance and obese.   Nose:  no sinus tenderness Mouth:  no oral lesions, triangular uvula Neck:  no masses, thyromegaly, or abnormal cervical nodes Lungs:  no wheezing or rales, decreased breath sounds, no dullness Heart:  regular rate and rhythm, S1, S2 without murmurs, rubs, gallops, or clicks Extremities:  no edema Neurologic:  normal CN II-XII and strength normal.   Cervical Nodes:  no significant adenopathy   Impression & Recommendations:  Problem # 1:  COPD (ICD-496) She has been stable on her current regimen.  Complete Medication List: 1)  Spiriva Handihaler 18 Mcg Caps (Tiotropium bromide monohydrate) .... One puff once daily 2)  Proair Hfa 108 (90 Base) Mcg/act Aers (Albuterol sulfate) .... Two puffs qid as needed 3)  Dilantin 100 Mg Caps (Phenytoin sodium extended) .[name] Kitchen.. 1 tablet three times a day by mouth 4)  Micardis 40 Mg Tabs (Telmisartan) .[name] Kitchen.. 1 by mouth daily 5)  Tylenol Go Tabs Extra Strength 500 Mg Chew (Acetaminophen) .... As needed 6)  Fish Oil 1000 Mg Caps (Omega-3 fatty acids) .... Take 1 capsule by mouth once a day 7)  Lipitor 20 Mg Tabs (Atorvastatin calcium) ....  Take 1 tab by mouth at bedtime 8)  Mucinex Dm Maximum Strength 60-1200 Mg Xr12h-tab (Dextromethorphan-guaifenesin) .... Once daily as needed 9)  Vitamin D-1000 Max St [phone] Mg-unit Tabs (Calcium-vitamin d) .... Once daily 10)  Vitamin B-12 1000 Mcg Tabs (Cyanocobalamin) .... One tablet by mouth once daily  Other Orders: Est. Patient Level III (14782)  Patient Instructions: 1)  Follow up in 4 to 6 months Prescriptions: SPIRIVA HANDIHALER 18 MCG CAPS (TIOTROPIUM BROMIDE MONOHYDRATE) one puff once daily  #30 x 6   Entered and Authorized by:   Coralyn Helling MD   Signed by:   Coralyn Helling MD on 10/17/2010   Method used:   Electronically to        [facility]  [address] [phone]* (retail)       [address]       [place], [place]  [address]       Ph: [phone] or [phone]       Fax: [phone]   RxID:   [id number]    Immunization History:  Influenza Immunization History:    Influenza:  historical (05/09/2010)

## 2011-01-24 NOTE — Op Note (Signed)
NAMECOLTON, Marshall                 ACCOUNT NO.:  [id number]   MEDICAL RECORD NO.:  [id number]          PATIENT TYPE:  AMB   LOCATION:  ENDO                         [facility]ACILITY[facility]  [facility]   PHYSICIAN:  Georgiana Spinner, M.D.    DATE OF BIRTH:  August 16, 1941   DATE OF PROCEDURE:  01/15/2005  DATE OF DISCHARGE:                                 OPERATIVE REPORT   PROCEDURE:  Upper endoscopy.   ENDOSCOPIST:  Georgiana Spinner, M.D.   INDICATIONS:  GERD.  Rule out Barrett's esophagus.   ANESTHESIA:  Demerol 60, Versed 7 milligrams.   DESCRIPTION OF PROCEDURE:  With the patient mildly sedated in the left  lateral decubitus position, the Olympus videoscopic endoscope was inserted  in the mouth, passed under direct vision through the esophagus which  appeared normal. Distal esophagus was approached and appeared mildly  inflamed.  I could not see a definite area of Barrett's at this point.  Part  of it is because of the anatomy at this time, but we photographed this area  and biopsied the erythematous changes.  I advanced into the stomach.  The  fundus, body, antrum, duodenal bulb, second portion of the duodenum were  visualized.  From this point, the endoscope was slowly withdrawn taking  circumferential views of duodenal mucosa until the endoscope had been pulled  back into the stomach placed in retroflexion to view the stomach from below  and we could see the esophagus through a loose wrap of the GE junction  around the endoscope.  This was photographed and we did biopsy this area in  retroflexed view.  The endoscope was straightened and withdrawn.  The  patient's vital signs, pulse oximeter remained stable.  The patient  tolerated procedure well without apparent complications.   FINDINGS:  Changes of mildly inflamed distal esophagus biopsied.   PLAN:  Await biopsy report. The patient will call me for results and follow-  up with me as an outpatient      GMO/MEDQ  D:  01/15/2005  T:  01/15/2005   Job:  [id number]   cc:   Loraine Leriche A. Waynard Edwards, M.D.  [address]  [place]  [place] [address]  Fax: [phone]

## 2011-01-24 NOTE — Consult Note (Signed)
NAMEDENIZ, ESKRIDGE NO.:  [id number]   MEDICAL RECORD NO.:  [id number]          PATIENT TYPE:  INP   LOCATION:  1828                         FACILITY:  [facility]   PHYSICIAN:  Lyn Records, M.D.   DATE OF BIRTH:  Jan 11, 1941   DATE OF CONSULTATION:  08/05/2005  DATE OF DISCHARGE:                                   CONSULTATION   CONCLUSIONS:  1.  Left upper chest pressure, intermittent, x2-3 weeks, cause is uncertain,      rule out coronary artery disease, rule out pulmonary, rule out      musculoskeletal, rule out other.  2.  Dyspnea and wheezing, question bronchitis versus chronic obstructive      pulmonary disease with bronchospasm.  3.  Gastroesophageal reflux disease.  4.  Seizure disorder, quiescent.  5.  Hypertension.   RECOMMENDATIONS:  1.  Treat possible bronchitis and pulmonary issues.  2.  Serial cardiac markers and BNP.  3.  EKG in a.m.  4.  If cardiac markers and ECGs are normal, consider stress Cardiolite once      wheezing/dyspnea is better.  5.  If positive markers, increased BNP or if she develops EKG abnormalities,      coronary angiography should be considered.  6.  For now, await data base.  7.  Dr. Elease Hashimoto will follow starting in a.m.   COMMENTS:  The patient is 70 years of age, is a chronic smoker for greater  than 35 years, and has a three to four-week history of intermittent left  upper chest tightness, particularly bothersome when she is angry.  In  addition, she has had near-continuous midback sharp pain and also has  noticed increased dyspnea and wheezing.  These seem to be three separate  complaints.  The sharp back discomfort has been present for greater than a  month.  The dyspnea and wheezing have been worsening over several months and  have been particularly severe over the last two weeks.  She is currently  continuing to have the sharp discomfort in her back at the present time.  She has also had some cough and phlegm  production.  The patient has a  greater than 35 pack-year smoking history.  She denies ethanol consumption.   PHYSICAL EXAMINATION:  VITAL SIGNS:  Blood pressure 140/70, heart rate 88.  SKIN:  Dry, no cyanosis.  HEENT:  Pupils equal and reactive.  CHEST:  Faint wheezes and rales throughout the lung fields.  CARDIAC:  No murmur, no gallop, no rub.  ABDOMEN:  Obese, no bruits are heard.  Bowel sounds are normal.  EXTREMITIES:  Trace edema.  NEUROLOGIC:  Grossly unremarkable.   Chest x-ray is pending.  Blood work is pending.  EKG is normal.  Laboratory  data is pending.      Lyn Records, M.D.  Electronically Signed     HWS/MEDQ  D:  08/05/2005  T:  08/06/2005  Job:  [id number]   cc:   Vesta Mixer, M.D.  Fax: [phone]   Loraine Leriche A. Perini, M.D.  Fax: [phone]

## 2011-01-24 NOTE — H&P (Signed)
NAMENAKEIA, [name]                 ACCOUNT NO.:  [id number]   MEDICAL RECORD NO.:  [id number]          PATIENT TYPE:  EMS   LOCATION:  MAJO                         FACILITY:  [facility]   PHYSICIAN:  Mark A. Perini, M.D.   DATE OF BIRTH:  September 16, 1940   DATE OF ADMISSION:  08/05/2005  DATE OF DISCHARGE:                                HISTORY & PHYSICAL   CHIEF COMPLAINT:  Shortness of breath, chest pain,  dyspnea on exertion.   HISTORY OF PRESENT ILLNESS:  Meredith Marshall is a pleasant 70 year old female with a  past medical history significant for seizure disorder, hypertension, and  tobacco use.  She reports worsening coughing in the last 2 to 3 weeks. She  also has had pain in the center of her back.  She also reports some feelings  of palpitations on and off.  Furthermore, she has reported some exertional  discomfort ion the left side of her chest as well as some on and off pain at  rest in the left upper part of her chest.  She has had worsening dyspnea on  exertion recently as well.  She has had some pedal edema for the last 2 to 3  weeks.  She has had no definite weight gain or weight loss.  Her cough is  somewhat productive of some whitish sputum.  She has been wheezing somewhat  as well.  She has been markedly more fatigued than usual as well.   In the office, she does have a slightly reduced peak flow at 360 liters per  minute.  Furthermore, she did have some rales on lung exam and possibly some  pulmonary edema seen on chest x-ray.  Given these findings and symptoms, she  is admitted to rule out new onset of congestive heart failure or unstable  angina.   PAST MEDICAL HISTORY:  1.  G3, P3 parity status, status post three C sections.  2.  Seizure disorder since 1967, also another seizure in 1973 and 1987; two      of these were nocturnal idiopathic seizures.  3.  History of cystic mastitis in 1971.  4.  Gastroesophageal reflux disease which is chronic and fairly severe.  She      did  have a Barrett's esophagus noted on the biopsy March 2006.  5.  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  6.  Postmenopausal since age 28.  7.  Recurrent cystitis.  8.  Thyroiditis in 1989.  9.  Osteopenia.  10. Generalized and nodal osteoarthritis.  11. Hypertension since 2003.  12. Possible rheumatoid arthritis at age 9, and she did have possibly some      stomach ulcers from cortisone shots as a teenager but no adult issues      such as those.  13. Right thumb surgery in December 2005.   ALLERGIES:  1.  SULFA causes hives.  2.  PHENOBARBITAL causes hives.  3.  TEGRETOL causes a rash.  4.  GENERIC DILANTIN causes a rash; however, brand name Dilantin is      tolerated well.  5.  ALTACE possibly caused a rash.  6.  She had a HAIR STYLING MOUSSE that caused a severe rash on her head at      one point in the past.   CURRENT MEDICATIONS:  1.  Dilantin 100 mg each morning and 200 mg each evening.  2.  Micardis 1/2 of an 80 mg pill daily.  3.  Aspirin 81 mg daily.  4.  Prilosec 20 mg once daily.   SOCIAL HISTORY:  She has been married since 1959.  Her husband, Meredith Marshall, is  with her.  She has two children and two grandchildren.  She had two years of  college.  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  She has  a 30-pack-year smoking history and continues to smoke one pack of cigarette  a day.  No alcohol history, no drug use history.   FAMILY HISTORY:  Father died at age 42.  Mother died at age 35 of lung  cancer.  There is a further history of lung cancer in a paternal aunt, and  there is a family history of breast cancer as well. Her two children are  healthy.   REVIEW OF SYSTEMS:  As per History of Present Illness.  In addition, there  are no definite fevers.  She was seen in the office approximately 6 to 7  weeks ago at which time she was felt to have a flareup of asthmatic  bronchitis.  She was treated with albuterol and a prednisone  taper at that  time.  Her symptoms did improve after that treatment; however, she has had  worse symptoms as noted above in the last 2 to 3 weeks.   PHYSICAL EXAMINATION:  VITAL SIGNS:  Weight 188 pounds, blood pressure  130/80, pulse 97 and regular.  Oxygen saturation 94% on room air.  Peak flow  360 liters per minute.  Temperature 98.5.  GENERAL: She is in no acute distress.  NECK:  There is no definite JVD.  LUNGS:  She does have decreased breath sounds bilaterally on exam.  She has  bronchial breath sounds bilaterally.  She does have a few rales noted at the  bases. No wheezes or rhonchi are noted.  HEART: Regular rate and rhythm with no significant murmur, rub, or gallop.  There is trace bilateral pedal edema.  ABDOMEN:  Soft and nontender.  NEUROLOGIC: Alert and oriented x4 and neurologically grossly intact.   LABORATORY DATA:  Chest x-ray shows no infiltrates or areas of  consolidation.  Heart size appears normal as does the mediastinal contour.  She may have some slight suggestion of interstitial edema.  This is compared  to an x-ray in March 2006.   EKG shows normal sinus rhythm with a heart rate of 90 beats per minute.  She  does have left atrial enlargement but no ST or T wave changes, and there is  no change to a previous EKG from 2005.   ASSESSMENT AND PLAN:  1.  On and off chest pain and worsening dyspnea on exertion in a patient      with cardiac risk factors consisting of age, high blood pressure, and      smoking history.  I am concerned that she may have new onset of      congestive heart failure or possibly some unstable angina-type symptoms.      However, all of her symptoms could be explained from chronic lung      disease such as chronic obstructive  pulmonary disease or asthma with a      flareup of her symptoms.   We will admit her to a telemetry bed. We will rule out for myocardial infarction with serial enzymes and electrocardiogram.  We will treat with   oxygen and treatment-dose Lovenox until we get further workup back.  We will  check a 2-D echocardiogram.  Also, I have asked for a cardiology  consultation to assist with her workup.  She is a full code status.  We will  continue her Dilantin and antireflux medication.           ______________________________  Redge Gainer Waynard Edwards, M.D.     MAP/MEDQ  D:  08/05/2005  T:  08/05/2005  Job:  [id number]   cc:   Lyn Records, M.D.  Fax: [phone]

## 2011-01-24 NOTE — Discharge Summary (Signed)
NAMEJAIME, DOME                 ACCOUNT NO.:  [id number]   MEDICAL RECORD NO.:  [id number]          PATIENT TYPE:  INP   LOCATION:  6526                         FACILITY:  [facility]   PHYSICIAN:  Mark A. Perini, M.D.   DATE OF BIRTH:  Sep 03, 1941   DATE OF ADMISSION:  08/05/2005  DATE OF DISCHARGE:  08/06/2005                                 DISCHARGE SUMMARY   CHIEF COMPLAINT:  Shortness of breath, chest pain, dyspnea on exertion.   DISCHARGE DIAGNOSES:  1.  No evidence of cardiac cause of her symptoms. Specifically serial      cardiac enzymes were negative. Serial electrocardiograms were normal. B-      type natriuretic peptide level was less than 30. Therefore, there was no      suggestive of congestive heart failure or any true unstable angina.  2.  Chronic obstructive pulmonary disease with exacerbation of chronic      obstructive pulmonary disease felt to be causing her symptoms at this      time.  3.  Seizure disorder on chronic Dilantin therapy with somewhat      subtherapeutic Dilantin level this admission.  4.  Hypertension well controlled.  5.  Severe gastroesophageal reflux disease with history of Barrett's      esophagus on chronic proton pump inhibitor therapy.  6.  Hilar fullness on the left side noted on the chest x-ray this admission.      Will require follow-up CT scan as an outpatient.   CONSULTATIONS:  Cardiology.   PROCEDURES:  None.   DISCHARGE MEDICATIONS:  1.  Dilantin is to be increased to 150 milligrams each morning and 200      milligrams each p.m. She is taking a 50 milligrams chew tablet along      with a 100 milligrams tablet each morning to obtain a 150 milligrams      dose each morning.  2.  Micardis 80 milligrams 1/2 tablet daily.  3.  Aspirin 81 milligrams daily.  4.  Prilosec 20 milligrams daily.  5.  Advair 250/50 1 puff twice daily. Rinse mouth after each use with water.  6.  Xopenex HFA 2 puffs every 4 hours as needed for wheezing,  coughing or      shortness of breath.  7.  Tylenol 650 milligrams every 6 hours as needed.  8.  Salt water gargles as needed.  9.  Over-the-counter Mucinex 600 milligrams twice daily as needed for cough      or congestion.   HISTORY OF PRESENT ILLNESS:  Danniella is a pleasant 70 year old female who  presented to the office August 05, 2005 with two to three weeks of pain in  the center of her back as well as feelings of palpitations on and off. She  also had exertional discomfort on the left side of her chest as well as mild  pain in left upper part of her chest. She did have some slightly decreased  peak flows in the office. She also noted some pedal edema. Given her signs  and symptoms, she was admitted for rule  out of new onset congestive heart  failure, unstable angina.   HOSPITAL COURSE:  Kanetra remained stable. Admission lab work showed no  evidence of congestive heart failure and her chest x-ray on admission did  not report any pulmonary edema. She is felt to have some background COPD and  she has a very long smoking history. She most likely has COPD with a COPD  exacerbation causing her symptoms at this time. She had serial sets of  cardiac enzymes which are normal and serial EKGs which were normal. On  August 06, 2005, she was deemed stable for discharge home.   DISCHARGE PHYSICAL EXAM:  Afebrile, maximum temperature 99.7, current  temperature 98.0, blood pressure 139/80, respiratory 18, 95% saturation on  room air. She was in no acute distress. She had some improved air movement  compared to the exam yesterday; however, she still had some bronchial breath  sounds. No wheezes, rales or rhonchi noted today. Heart was regular rate and  rhythm with no significant murmur. Abdomen was soft and nontender. There was  no significant edema noted.   LABORATORY DATA:  White count 5.2, hemoglobin 13.7, platelet count 205,000.  Dilantin level 8.5. TSH 1.38. CK 33, MB 0.8. Previous CK was  41, previous MB  was 0.8. Troponin-I was 0.02 and 0.01 on serial measurements. Admission CMET  was essentially normal. BNP on admission was less than 30. Magnesium was  2.2. Chest x-ray on admission showed suspected background COPD with no acute  infiltrate. There was left hilar prominence likely pulmonary vasculature but  adenopathy cannot be excluded. Consider follow-up CT scan. EKG was normal.   DISCHARGE INSTRUCTIONS:  Tatisha is to stop smoking. She is to follow a low-  salt diet. She is to follow up in my office in three to four weeks. Our  office will call her to schedule a follow-up CT scan of her lungs in two to  three weeks as well as a follow-up pulmonary function testing in the next  three to four weeks. Furthermore, she will follow with Dr. Elease Hashimoto for  possible outpatient stress test.           ______________________________  Redge Gainer. Waynard Edwards, M.D.     MAP/MEDQ  D:  08/06/2005  T:  08/06/2005  Job:  [id number]   cc:   Vesta Mixer, M.D.  Fax: [phone]

## 2011-01-29 ENCOUNTER — Encounter: Payer: Self-pay | Admitting: Pulmonary Disease

## 2011-02-10 ENCOUNTER — Ambulatory Visit (INDEPENDENT_AMBULATORY_CARE_PROVIDER_SITE_OTHER): Payer: Medicare Other | Admitting: Pulmonary Disease

## 2011-02-10 ENCOUNTER — Encounter: Payer: Self-pay | Admitting: Pulmonary Disease

## 2011-02-10 VITALS — BP 120/80 | HR 91 | Temp 98.0°F | Ht 63.5 in | Wt 223.8 lb

## 2011-02-10 DIAGNOSIS — J4489 Other specified chronic obstructive pulmonary disease: Secondary | ICD-10-CM

## 2011-02-10 DIAGNOSIS — J449 Chronic obstructive pulmonary disease, unspecified: Secondary | ICD-10-CM

## 2011-02-10 NOTE — Patient Instructions (Signed)
Follow up in 6 months 

## 2011-02-10 NOTE — Assessment & Plan Note (Addendum)
Stable on her current regimen. 

## 2011-02-10 NOTE — Progress Notes (Signed)
Subjective:    Patient ID: Meredith Marshall, female    DOB: December 10, 1940, 70 y.o.   MRN: [id number]  HPI 70 yo female former smoker with known hx of COPD.  Her breathing is about the same.  She gets occasional cough.  She has noticed some swelling in her ankles, but this gets better when she keeps her feet up.  Past Medical History  Diagnosis Date  . GERD (gastroesophageal reflux disease)   . Barrett esophagus   . Hemorrhoids   . 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 Tobacco abuse   . Chronic cystic mastitis 1971  . Cervical spine disease   . Thyroiditis 1989  . Osteoporosis   . COPD (chronic obstructive pulmonary disease)      Family History  Problem Relation Age of Onset  . Lung cancer Mother   . Cancer Father     larynx  . Colon cancer Neg Hx      History   Social History  . Marital Status: Married    Spouse Name: N/A    Number of Children: 2  . Years of Education: N/A   Occupational History  . 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 Smoking status: Former Smoker -- 1.0 packs/day for 35 years    Types: Cigarettes    Quit date: 11/06/2008  . Smokeless tobacco: Not on file  . Alcohol Use: No  . Drug Use: No  . Sexually Active: Not on file   Other Topics Concern  . Not on file   Social History Narrative   [place]     Allergies  Allergen Reactions  . Carbamazepine     REACTION: hives, itching  . Phenobarbital     REACTION: hives, itching  . Phenytoin     REACTION: generic - hives/itching  . Ramipril     REACTION: hives, itching  . Sulfonamide Derivatives     REACTION: hives, itching     Outpatient Prescriptions Prior to Visit  Medication Sig Dispense Refill  . acetaminophen (TYLENOL) 500 MG tablet Take by mouth as needed.        [name] Kitchen albuterol (PROAIR HFA) 108 (90 BASE) MCG/ACT inhaler Inhale 2 puffs into the lungs every 6 (six) hours as needed.        [name] Kitchen atorvastatin (LIPITOR)  20 MG tablet Take 20 mg by mouth at bedtime.        . Cholecalciferol (VITAMIN D) 1000 UNITS capsule Take 1,000 Units by mouth daily.        [name] Kitchen dextromethorphan-guaiFENesin (MUCINEX DM) 30-600 MG per 12 hr tablet Take 1 tablet by mouth every 12 (twelve) hours as needed.        . Omega-3 Fatty Acids (FISH OIL) 1000 MG CAPS Take 1 capsule by mouth daily.        . phenytoin (DILANTIN) 100 MG ER capsule Take 100 mg by mouth 3 (three) times daily.        [name] Kitchen telmisartan (MICARDIS) 40 MG tablet Take 40 mg by mouth daily.        [name] Kitchen tiotropium (SPIRIVA) 18 MCG inhalation capsule Place 18 mcg into inhaler and inhale daily.        . vitamin B-12 (CYANOCOBALAMIN) 1000 MCG tablet Take 1,000 mcg by mouth daily.         Review of Systems     Objective:   Physical Exam  BP  120/80  Pulse 91  Temp(Src) 98 F (36.7 C) (Oral)  Ht 5' 3.5" (1.613 m)  Wt 223 lb 12.8 oz (101.515 kg)  BMI 39.02 kg/m2  SpO2 95%  General: normal appearance and obese.  Nose: no sinus tenderness  Mouth: no oral lesions, triangular uvula  Neck: no masses, thyromegaly, or abnormal cervical nodes  Lungs: no wheezing or rales, decreased breath sounds, no dullness  Heart: regular rate and rhythm, S1, S2 without murmurs, rubs, gallops, or clicks  Extremities: no edema  Neurologic: normal CN II-XII and strength normal.  Cervical Nodes: no significant adenopathy        Assessment & Plan:

## 2011-08-05 ENCOUNTER — Ambulatory Visit (INDEPENDENT_AMBULATORY_CARE_PROVIDER_SITE_OTHER): Payer: Medicare Other | Admitting: Pulmonary Disease

## 2011-08-05 ENCOUNTER — Encounter: Payer: Self-pay | Admitting: Pulmonary Disease

## 2011-08-05 VITALS — BP 148/78 | HR 92 | Temp 98.4°F | Ht 63.5 in | Wt 219.5 lb

## 2011-08-05 DIAGNOSIS — J449 Chronic obstructive pulmonary disease, unspecified: Secondary | ICD-10-CM

## 2011-08-05 NOTE — Patient Instructions (Signed)
Follow up in 6 months 

## 2011-08-05 NOTE — Assessment & Plan Note (Signed)
Stable on her current regimen. 

## 2011-08-05 NOTE — Progress Notes (Signed)
Chief Complaint  Patient presents with  . Follow-up    Pt states her breathing has unchanged and has no complaints    History of Present Illness: Meredith Marshall is a 70 y.o. female former smoker with known hx of COPD.  Her breathing has been doing okay.  She notices more trouble with damp weather.  She is not having much cough, wheeze, or sputum.  She has not needed to use albuterol much.  She is able to keep up with her activities.  She denies chest pain or leg swelling.   Past Medical History  Diagnosis Date  . GERD (gastroesophageal reflux disease)   . Barrett esophagus   . Hemorrhoids   . 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 Tobacco abuse   . Chronic cystic mastitis 1971  . Cervical spine disease   . Thyroiditis 1989  . Osteoporosis   . COPD (chronic obstructive pulmonary disease)     Past Surgical History  Procedure Date  . Tonsillectomy   . Cesarean section     x 3  . Breast biopsy     bilateral    Current Outpatient Prescriptions on File Prior to Visit  Medication Sig Dispense Refill  . acetaminophen (TYLENOL) 500 MG tablet Take by mouth as needed.        [name] Kitchen albuterol (PROAIR HFA) 108 (90 BASE) MCG/ACT inhaler Inhale 2 puffs into the lungs every 6 (six) hours as needed.        [name] Kitchen atorvastatin (LIPITOR) 20 MG tablet Take 20 mg by mouth at bedtime.        . Cholecalciferol (VITAMIN D) 1000 UNITS capsule Take 1,000 Units by mouth daily.        [name] Kitchen dextromethorphan-guaiFENesin (MUCINEX DM) 30-600 MG per 12 hr tablet Take 1 tablet by mouth every 12 (twelve) hours as needed.        . Omega-3 Fatty Acids (FISH OIL) 1000 MG CAPS Take 1 capsule by mouth daily.        . phenytoin (DILANTIN) 100 MG ER capsule Take 100 mg by mouth 3 (three) times daily.        [name] Kitchen telmisartan (MICARDIS) 40 MG tablet Take 40 mg by mouth daily.        [name] Kitchen tiotropium (SPIRIVA) 18 MCG inhalation capsule Place 18 mcg into inhaler and inhale daily.        . vitamin B-12  (CYANOCOBALAMIN) 1000 MCG tablet Take 1,000 mcg by mouth daily.          Allergies  Allergen Reactions  . Carbamazepine     REACTION: hives, itching  . Phenobarbital     REACTION: hives, itching  . Phenytoin     REACTION: generic - hives/itching  . Ramipril     REACTION: hives, itching  . Sulfonamide Derivatives     REACTION: hives, itching    Physical Exam:  Blood pressure 148/78, pulse 92, temperature 98.4 F (36.9 C), temperature source Oral, height 5' 3.5" (1.613 m), weight 219 lb 8 oz (99.565 kg), SpO2 97.00%.  General - Obese HEENT - no sinus tenderness, no oral exudate, no LAN Cardiac - s1s2 regular, no murmur Chest - no wheeze/rales/dullness Abdomen - soft, nontender Extremities - no edema Skin - no rashes Neurologic - normal strength Psychiatric - normal mood, behavior   Assessment/Plan:  COPD Stable on her current regimen.       Outpatient Encounter Prescriptions as of 08/05/2011  Medication Sig Dispense Refill  .  acetaminophen (TYLENOL) 500 MG tablet Take by mouth as needed.        [name] Kitchen albuterol (PROAIR HFA) 108 (90 BASE) MCG/ACT inhaler Inhale 2 puffs into the lungs every 6 (six) hours as needed.        [name] Kitchen atorvastatin (LIPITOR) 20 MG tablet Take 20 mg by mouth at bedtime.        . Cholecalciferol (VITAMIN D) 1000 UNITS capsule Take 1,000 Units by mouth daily.        [name] Kitchen dextromethorphan-guaiFENesin (MUCINEX DM) 30-600 MG per 12 hr tablet Take 1 tablet by mouth every 12 (twelve) hours as needed.        . Omega-3 Fatty Acids (FISH OIL) 1000 MG CAPS Take 1 capsule by mouth daily.        . phenytoin (DILANTIN) 100 MG ER capsule Take 100 mg by mouth 3 (three) times daily.        [name] Kitchen telmisartan (MICARDIS) 40 MG tablet Take 40 mg by mouth daily.        [name] Kitchen tiotropium (SPIRIVA) 18 MCG inhalation capsule Place 18 mcg into inhaler and inhale daily.        . vitamin B-12 (CYANOCOBALAMIN) 1000 MCG tablet Take 1,000 mcg by mouth daily.          [name] Pager:   [phone] 08/05/2011, 11:46 AM

## 2011-10-30 ENCOUNTER — Ambulatory Visit (INDEPENDENT_AMBULATORY_CARE_PROVIDER_SITE_OTHER): Payer: Medicare Other | Admitting: Cardiovascular Disease

## 2011-10-30 ENCOUNTER — Encounter: Payer: Self-pay | Admitting: Cardiovascular Disease

## 2011-10-30 DIAGNOSIS — R079 Chest pain, unspecified: Secondary | ICD-10-CM | POA: Insufficient documentation

## 2011-10-30 NOTE — Patient Instructions (Signed)
Your physician has requested that you have a lexiscan myoview. For further information please visit [email or web address] Please follow instruction sheet, as given.  Your physician recommends that you schedule a follow-up appointment with Dr. Elease Hashimoto as needed.

## 2011-10-30 NOTE — Progress Notes (Signed)
[name] Date of Birth  1941/01/02 [facility]     [facility]  [address]    [address]   [address] [place], [place]  [address]    [place], [place]  [address] [phone]  Fax  [phone]  [phone]  Fax [phone]  1. Hx of Noncardiac chest pain 2. COPD - smoked in the past 3.  Hypertension 4. Hiatal hernia 5. Gastroesophageal reflux  History of Present Illness:  Meredith Marshall a 71 year old female with a history of chest pains in the past. We have performed several normal stress test on her in the past.  She presents today for further evaluation of some recurrent chest pain and left arm pain.  These episodes are associated with various activities but also can occur at rest. It is described as a dull, heavy pain in the left upper quadrant with radiation down the left arm.  Current Outpatient Prescriptions on File Prior to Visit  Medication Sig Dispense Refill  . acetaminophen (TYLENOL) 500 MG tablet Take by mouth as needed.        [name] Kitchen albuterol (PROAIR HFA) 108 (90 BASE) MCG/ACT inhaler Inhale 2 puffs into the lungs every 6 (six) hours as needed.        [name] Kitchen atorvastatin (LIPITOR) 20 MG tablet Take 20 mg by mouth at bedtime.        . Cholecalciferol (VITAMIN D) 1000 UNITS capsule Take 1,000 Units by mouth daily.        [name] Kitchen dextromethorphan-guaiFENesin (MUCINEX DM) 30-600 MG per 12 hr tablet Take 1 tablet by mouth every 12 (twelve) hours as needed.        . Omega-3 Fatty Acids (FISH OIL) 1000 MG CAPS Take 1 capsule by mouth daily.        . phenytoin (DILANTIN) 100 MG ER capsule Take 100 mg by mouth 3 (three) times daily.        [name] Kitchen telmisartan (MICARDIS) 40 MG tablet Take 40 mg by mouth daily.        [name] Kitchen tiotropium (SPIRIVA) 18 MCG inhalation capsule Place 18 mcg into inhaler and inhale daily.        . vitamin B-12 (CYANOCOBALAMIN) 1000 MCG tablet Take 1,000 mcg by mouth daily.          Allergies  Allergen Reactions  . Carbamazepine     REACTION: hives,  itching  . Phenobarbital     REACTION: hives, itching  . Phenytoin     REACTION: generic - hives/itching  . Ramipril     REACTION: hives, itching  . Sulfonamide Derivatives     REACTION: hives, itching    Past Medical History  Diagnosis Date  . GERD (gastroesophageal reflux disease)   . Barrett esophagus   . Hemorrhoids   . 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 Tobacco abuse   . Chronic cystic mastitis 1971  . Cervical spine disease   . Thyroiditis 1989  . Osteoporosis   . COPD (chronic obstructive pulmonary disease)     Past Surgical History  Procedure Date  . Tonsillectomy   . Cesarean section     x 3  . Breast biopsy     bilateral    History  Smoking status  . Former Smoker -- 1.0 packs/day for 35 years  . Types: Cigarettes  . Quit date: 11/06/2008  Smokeless tobacco  . Not on file    History  Alcohol Use No  Family History  Problem Relation Age of Onset  . Lung cancer Mother   . Cancer Father     larynx  . Colon cancer Neg Hx     Reviw of Systems:  Reviewed in the HPI.  All other systems are negative.  Physical Exam: Blood pressure 166/85, pulse 88, height 5\' 3"  (1.6 m), weight 220 lb 12.8 oz (100.154 kg). General: Well developed, well nourished, in no acute distress.  Head: Normocephalic, atraumatic, sclera non-icteric, mucus membranes are moist,   Neck: Supple. Negative for carotid bruits. JVD not elevated.  Lungs: Clear bilaterally to auscultation without wheezes, rales, or rhonchi. Breathing is normal.  Heart: RRR with S1 S2. No murmurs, rubs, or gallops  Abdomen: Soft, non-tender, non-distended with normal bowel sounds. No hepatomegaly. No rebound/guarding. No masses.  Msk:  Strength and tone appear normal for age.  Extremities: No clubbing or cyanosis. No edema.  Distal pedal pulses are 2+ and equal bilaterally.  Neuro: Alert and oriented X 3. Moves all extremities spontaneously.  Psych:  Responds to  questions appropriately with a normal affect.  ECG: NSR. Normal ECG  Assessment / Plan:

## 2011-10-30 NOTE — Assessment & Plan Note (Addendum)
Ms. Harries presents with further episodes of chest pain. These episodes are described as a tightness with radiation to the upper left chest and down the left arm. They're associated with exertion but also can occur at rest. Her EKG is normal.    Given her history of cigarette smoking and COPD I would prefer that we do a two-day Lexiscan Myoivew study for further evaluation of this chest pain.  I will see her again on as-needed basis. We will see her sooner if she has recurrent episodes of chest pain or if the Myoview study is abnormal.

## 2011-11-11 ENCOUNTER — Ambulatory Visit (HOSPITAL_COMMUNITY): Payer: Medicare Other | Attending: Cardiology | Admitting: Radiology

## 2011-11-11 DIAGNOSIS — R079 Chest pain, unspecified: Secondary | ICD-10-CM

## 2011-11-11 DIAGNOSIS — I10 Essential (primary) hypertension: Secondary | ICD-10-CM | POA: Insufficient documentation

## 2011-11-11 DIAGNOSIS — Z87891 Personal history of nicotine dependence: Secondary | ICD-10-CM | POA: Insufficient documentation

## 2011-11-11 DIAGNOSIS — J449 Chronic obstructive pulmonary disease, unspecified: Secondary | ICD-10-CM | POA: Insufficient documentation

## 2011-11-11 DIAGNOSIS — J4489 Other specified chronic obstructive pulmonary disease: Secondary | ICD-10-CM | POA: Insufficient documentation

## 2011-11-11 DIAGNOSIS — E785 Hyperlipidemia, unspecified: Secondary | ICD-10-CM | POA: Insufficient documentation

## 2011-11-11 DIAGNOSIS — E669 Obesity, unspecified: Secondary | ICD-10-CM | POA: Insufficient documentation

## 2011-11-11 MED ORDER — REGADENOSON 0.4 MG/5ML IV SOLN
0.4000 mg | Freq: Once | INTRAVENOUS | Status: AC
  Administered 2011-11-11: 0.4 mg via INTRAVENOUS

## 2011-11-11 MED ORDER — TECHNETIUM TC 99M TETROFOSMIN IV KIT
30.0000 | PACK | Freq: Once | INTRAVENOUS | Status: AC | PRN
  Administered 2011-11-11: 30 via INTRAVENOUS

## 2011-11-11 NOTE — Progress Notes (Signed)
[facility] SITE 3 NUCLEAR MED [address] [place] [address] [phone]  Cardiology Nuclear Med Study  Meredith Marshall is a 71 y.o. female [id number] [birth year]   Nuclear Med Background Indication for Stress Test:  Evaluation for Ischemia History: '09 Myocardial Perfusion Study: EF: 83% NL, COPD Cardiac Risk Factors: History of Smoking, Hypertension, Lipids and Obesity  Symptoms:  Chest Pain and Chest Pressure   Nuclear Pre-Procedure Caffeine/Decaff Intake:  8:00pm NPO After: 8:00pm   Lungs:  clear IV 0.9% NS with Angio Cath:  22g  IV Site: R Hand  IV Started by:  Cathlyn Parsons, RN  Chest Size (in):  42 Cup Size: D  Height: 5\' 3"  (1.6 m)  Weight:  218 lb (98.884 kg)  BMI:  Body mass index is 38.62 kg/(m^2). Tech Comments:  n/a    Nuclear Med Study 1 or 2 day study: 2 day  Stress Test Type:  Treadmill/Lexiscan  Reading MD: Olga Millers, MD  Order Authorizing Provider:  Jannette Spanner  Resting Radionuclide: Technetium 55m Tetrofosmin  Resting Radionuclide Dose: 33.0 mCi  On 11-12-11  Stress Radionuclide:  Technetium 24m Tetrofosmin  Stress Radionuclide Dose: 32.8 mCi  On      11-11-11          Stress Protocol Rest HR: 89 Stress HR: 126  Rest BP: 128/80 Stress BP: 186/53  Exercise Time (min): n/a METS: n/a   Predicted Max HR: 150 bpm % Max HR: 84 bpm Rate Pressure Product: 56387   Dose of Adenosine (mg):  n/a Dose of Lexiscan: 0.4 mg  Dose of Atropine (mg): n/a Dose of Dobutamine: n/a mcg/kg/min (at max HR)  Stress Test Technologist: Milana Na, EMT-P  Nuclear Technologist:  Domenic Polite, CNMT     Rest Procedure:  Myocardial perfusion imaging was performed at rest 45 minutes following the intravenous administration of Technetium 110m Tetrofosmin. Rest ECG: NSR  Stress Procedure:  The patient received IV Lexiscan 0.4 mg over 15-seconds with concurrent low level exercise and then Technetium 11m Tetrofosmin was injected at  30-seconds while the patient continued walking one more minute.  There were no significant changes, + sob, and chest tightness with Lexiscan.  Quantitative spect images were obtained after a 45-minute delay. Stress ECG: No significant ST segment change suggestive of ischemia.  QPS Raw Data Images:  Acquisition technically good; normal left ventricular size. Stress Images:  Normal homogeneous uptake in all areas of the myocardium. Rest Images:  Normal homogeneous uptake in all areas of the myocardium. Subtraction (SDS):  No evidence of ischemia. Transient Ischemic Dilatation (Normal <1.22):  0.83 Lung/Heart Ratio (Normal <0.45):  0.31  Quantitative Gated Spect Images QGS EDV:  51 ml QGS ESV:  8 ml QGS cine images:  NL LV Function; NL Wall Motion QGS EF: 84%  Impression Exercise Capacity:  Lexiscan with no exercise. BP Response:  Normal blood pressure response. Clinical Symptoms:  There is dyspnea and chest pain. ECG Impression:  No significant ST segment change suggestive of ischemia. Comparison with Prior Nuclear Study: No significant change from previous study  Overall Impression:  Normal stress nuclear study.    Olga Millers

## 2011-11-12 ENCOUNTER — Ambulatory Visit (HOSPITAL_COMMUNITY): Payer: Medicare Other | Attending: Cardiology

## 2011-11-12 DIAGNOSIS — R0989 Other specified symptoms and signs involving the circulatory and respiratory systems: Secondary | ICD-10-CM

## 2011-11-12 MED ORDER — TECHNETIUM TC 99M TETROFOSMIN IV KIT
33.0000 | PACK | Freq: Once | INTRAVENOUS | Status: AC | PRN
  Administered 2011-11-12: 33 via INTRAVENOUS

## 2011-11-14 ENCOUNTER — Telehealth: Payer: Self-pay | Admitting: Cardiovascular Disease

## 2011-11-14 NOTE — Telephone Encounter (Signed)
Pt would like results of stress test 

## 2011-11-14 NOTE — Telephone Encounter (Signed)
Pt is requesting results of nuclear study.  I read her the results but I didn't see any comments from Dr Elease Hashimoto and told Mrs Tamplin I would have him review it and let her know what he said.

## 2011-11-17 NOTE — Telephone Encounter (Signed)
Dr Elease Hashimoto please review and advise.

## 2011-11-18 NOTE — Telephone Encounter (Signed)
Normal stress test

## 2011-11-19 NOTE — Telephone Encounter (Signed)
Normal result called.

## 2012-02-11 ENCOUNTER — Ambulatory Visit (INDEPENDENT_AMBULATORY_CARE_PROVIDER_SITE_OTHER): Payer: Medicare Other | Admitting: Pulmonary Disease

## 2012-02-11 ENCOUNTER — Encounter: Payer: Self-pay | Admitting: Pulmonary Disease

## 2012-02-11 VITALS — BP 138/68 | HR 90 | Temp 98.1°F | Ht 63.0 in | Wt 220.4 lb

## 2012-02-11 DIAGNOSIS — J4489 Other specified chronic obstructive pulmonary disease: Secondary | ICD-10-CM

## 2012-02-11 DIAGNOSIS — J449 Chronic obstructive pulmonary disease, unspecified: Secondary | ICD-10-CM

## 2012-02-11 NOTE — Patient Instructions (Signed)
Follow-up in one year.

## 2012-02-11 NOTE — Progress Notes (Signed)
Chief Complaint  Patient presents with  . Follow-up    Pt c/o increase SOB, cough w/ grey phlem, chest congestion, wheezing, chest tx x 10 days--is getting better--taking mucinex    History of Present Illness: Meredith Marshall is a 71 y.o. female former smoker with known hx of COPD.  She had trouble with reflux about 10 days ago after eating salmon.  This caused more cough and clear sputum.  She starting using dexilant and mucinex, and has been doing better.  She was doing well with her breathing prior to this.   Past Medical History  Diagnosis Date  . GERD (gastroesophageal reflux disease)   . Barrett esophagus   . Hemorrhoids   . 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 Tobacco abuse   . Chronic cystic mastitis 1971  . Cervical spine disease   . Thyroiditis 1989  . Osteoporosis   . COPD (chronic obstructive pulmonary disease)     Past Surgical History  Procedure Date  . Tonsillectomy   . Cesarean section     x 3  . Breast biopsy     bilateral      Allergies  Allergen Reactions  . Carbamazepine     REACTION: hives, itching  . Phenobarbital     REACTION: hives, itching  . Phenytoin     REACTION: generic - hives/itching  . Ramipril     REACTION: hives, itching  . Sulfonamide Derivatives     REACTION: hives, itching    Physical Exam:  Blood pressure 138/68, pulse 90, temperature 98.1 F (36.7 C), temperature source Oral, height 5\' 3"  (1.6 m), weight 220 lb 6.4 oz (99.973 kg), SpO2 96.00%.  General - Obese HEENT - no sinus tenderness, no oral exudate, no LAN Cardiac - s1s2 regular, no murmur Chest - no wheeze/rales/dullness Abdomen - soft, nontender Extremities - no edema Skin - no rashes Neurologic - normal strength Psychiatric - normal mood, behavior   Assessment/Plan:    Outpatient Encounter Prescriptions as of 02/11/2012  Medication Sig Dispense Refill  . acetaminophen (TYLENOL) 500 MG tablet Take by mouth as needed.          [name] Kitchen albuterol (PROAIR HFA) 108 (90 BASE) MCG/ACT inhaler Inhale 2 puffs into the lungs every 6 (six) hours as needed.        [name] Kitchen atorvastatin (LIPITOR) 20 MG tablet Take 20 mg by mouth at bedtime.        . Cholecalciferol (VITAMIN D) 1000 UNITS capsule Take 1,000 Units by mouth daily.        [name] Kitchen DEXILANT 60 MG capsule Once a day      . guaiFENesin (MUCINEX) 600 MG 12 hr tablet Take 600 mg by mouth 2 (two) times daily.      . Omega-3 Fatty Acids (FISH OIL) 1000 MG CAPS Take 1 capsule by mouth daily.        . phenytoin (DILANTIN) 100 MG ER capsule Take 100 mg by mouth 3 (three) times daily.        [name] Kitchen telmisartan (MICARDIS) 40 MG tablet Take 40 mg by mouth daily.        [name] Kitchen tiotropium (SPIRIVA) 18 MCG inhalation capsule Place 18 mcg into inhaler and inhale daily.        . vitamin B-12 (CYANOCOBALAMIN) 1000 MCG tablet Take 1,000 mcg by mouth daily.        [name] Kitchen DISCONTD: dextromethorphan-guaiFENesin (MUCINEX DM) 30-600 MG per 12 hr tablet Take 1 tablet  by mouth every 12 (twelve) hours as needed.          Rudi Bunyard Pager:  [phone] 02/11/2012, 10:57 AM

## 2012-02-11 NOTE — Assessment & Plan Note (Signed)
She is doing well on her current inhaler regimen. 

## 2012-10-26 ENCOUNTER — Encounter: Payer: Self-pay | Admitting: Cardiovascular Disease

## 2012-12-20 ENCOUNTER — Encounter: Payer: Self-pay | Admitting: Adult Health

## 2012-12-20 ENCOUNTER — Ambulatory Visit (INDEPENDENT_AMBULATORY_CARE_PROVIDER_SITE_OTHER): Payer: Medicare Other | Admitting: Adult Health

## 2012-12-20 VITALS — BP 158/74 | HR 100 | Temp 100.0°F | Ht 63.0 in | Wt 223.0 lb

## 2012-12-20 DIAGNOSIS — J449 Chronic obstructive pulmonary disease, unspecified: Secondary | ICD-10-CM

## 2012-12-20 MED ORDER — LEVALBUTEROL HCL 0.63 MG/3ML IN NEBU
0.6300 mg | INHALATION_SOLUTION | Freq: Once | RESPIRATORY_TRACT | Status: AC
  Administered 2012-12-20: 0.63 mg via RESPIRATORY_TRACT

## 2012-12-20 MED ORDER — PREDNISONE 10 MG PO TABS: ORAL_TABLET | ORAL | Status: DC

## 2012-12-20 MED ORDER — AMOXICILLIN-POT CLAVULANATE 875-125 MG PO TABS: 1.0000 | ORAL_TABLET | Freq: Two times a day (BID) | ORAL | Status: AC

## 2012-12-20 NOTE — Progress Notes (Signed)
  Subjective:    Patient ID: Meredith Marshall, female    DOB: [birth year], 72 y.o.   MRN: [id number]  HPI 72 yo female with known hx of COPD   12/20/2012 Acute OV  Complains of coughing and wheezing x 2 week ago,  pt.  has  been feeling bad and feverish.  Pt. als has a drainage and her mucus is colorish.  Has coughing and sneezing, Has green/yellow mucus. No sinus pain. Lots coughing that wakes her up at night. Taking mucinex without much help.     Review of Systems Constitutional:   No  weight loss, night sweats,  + Fevers, chills, fatigue, or  lassitude.  HEENT:   No headaches,  Difficulty swallowing,  Tooth/dental problems, or  Sore throat,                No sneezing, itching, ear ache,  +nasal congestion, post nasal drip,   CV:  No chest pain,  Orthopnea, PND, swelling in lower extremities, anasarca, dizziness, palpitations, syncope.   GI  No heartburn, indigestion, abdominal pain, nausea, vomiting, diarrhea, change in bowel habits, loss of appetite, bloody stools.   Resp:  .  No chest wall deformity  Skin: no rash or lesions.  GU: no dysuria, change in color of urine, no urgency or frequency.  No flank pain, no hematuria   MS:  No joint pain or swelling.  No decreased range of motion.  No back pain.  Psych:  No change in mood or affect. No depression or anxiety.  No memory loss.         Objective:   Physical Exam GEN: A/Ox3; pleasant , NAD obese   HEENT:  [place]/AT,  EACs-clear, TMs-wnl, NOSE-clear, THROAT-clear, no lesions, + postnasal drip or exudate noted.   NECK:  Supple w/ fair ROM; no JVD; normal carotid impulses w/o bruits; no thyromegaly or nodules palpated; no lymphadenopathy.  RESP  Few scattered rhonchi and exp wheezes no accessory muscle use, no dullness to percussion  CARD:  RRR, no m/r/g  , no peripheral edema, pulses intact, no cyanosis or clubbing.  GI:   Soft & nt; nml bowel sounds; no organomegaly or masses detected.  Musco: Warm bil, no deformities or  joint swelling noted.   Neuro: alert, no focal deficits noted.    Skin: Warm, no lesions or rashes         Assessment & Plan:

## 2012-12-20 NOTE — Progress Notes (Signed)
Reviewed and agree with assessment/plan. 

## 2012-12-20 NOTE — Patient Instructions (Signed)
Augmentin 875 mg twice daily for 7 days, take with food. Mucinex DM twice daily as needed. For cough and congestion. Prednisone taper over next week. Fluids and rest. Tylenol as needed. Follow up Dr. Craige Cotta  As planned and As needed   Please contact office for sooner follow up if symptoms do not improve or worsen or seek emergency care

## 2012-12-20 NOTE — Assessment & Plan Note (Signed)
Flare  xopenex neb x 1   Plan  Augmentin 875 mg twice daily for 7 days, take with food. Mucinex DM twice daily as needed. For cough and congestion. Prednisone taper over next week. Fluids and rest. Tylenol as needed. Follow up Dr. Craige Cotta  As planned and As needed   Please contact office for sooner follow up if symptoms do not improve or worsen or seek emergency care

## 2012-12-20 NOTE — Addendum Note (Signed)
Addended by: Luther Parody D on: 12/20/2012 10:39 AM   Modules accepted: Orders

## 2013-03-01 ENCOUNTER — Ambulatory Visit: Payer: Medicare Other | Admitting: Pulmonary Disease

## 2013-03-02 ENCOUNTER — Ambulatory Visit: Payer: Medicare Other | Admitting: Pulmonary Disease

## 2013-03-25 ENCOUNTER — Encounter: Payer: Self-pay | Admitting: Pulmonary Disease

## 2013-03-25 ENCOUNTER — Ambulatory Visit (INDEPENDENT_AMBULATORY_CARE_PROVIDER_SITE_OTHER): Payer: Medicare Other | Admitting: Pulmonary Disease

## 2013-03-25 VITALS — BP 134/80 | HR 84 | Temp 98.9°F | Ht 63.0 in | Wt 223.5 lb

## 2013-03-25 DIAGNOSIS — J449 Chronic obstructive pulmonary disease, unspecified: Secondary | ICD-10-CM

## 2013-03-25 NOTE — Progress Notes (Signed)
Chief Complaint  Patient presents with  . COPD    pt stated that she does have some SOB.  she is not using the spiriva.      History of Present Illness: Meredith Marshall is a 72 y.o. female former smoker with COPD.  She was treated for an exacerbation in April.  She has been doing better since.  She has not used spiriva for the past several months.  It caused her to get dry mouth.  She gets occasional dyspnea with exertion, but this is usually related to different exposures or change in weather.  She rarely needs to use proair.  She is able to keep up with her activity w/o too much difficulty.  TESTS: PFT 06/04/09 >> FEV1 1.80(87%), FVC 2.96(103%), FEV1% 61, TLC 5.29(110%), DLCO 66%, no BD  [name]  has a past medical history of GERD (gastroesophageal reflux disease); Barrett esophagus; Hemorrhoids; Seizure; Tobacco abuse; Chronic cystic mastitis (1971); Cervical spine disease; Thyroiditis (1989); Osteoporosis; and COPD (chronic obstructive pulmonary disease).  [name]  has past surgical history that includes Tonsillectomy; Cesarean section; and Breast biopsy.  Prior to Admission medications   Medication Sig Start Date End Date Taking? Authorizing Provider  acetaminophen (TYLENOL) 500 MG tablet Take by mouth as needed.     Yes Historical Provider, MD  albuterol (PROAIR HFA) 108 (90 BASE) MCG/ACT inhaler Inhale 2 puffs into the lungs every 6 (six) hours as needed.     Yes Historical Provider, MD  atorvastatin (LIPITOR) 20 MG tablet Take 20 mg by mouth at bedtime.     Yes Historical Provider, MD  Cholecalciferol (VITAMIN D) 1000 UNITS capsule Take 1,000 Units by mouth daily.     Yes Historical Provider, MD  DEXILANT 60 MG capsule Once a day 01/28/12  Yes Historical Provider, MD  guaiFENesin (MUCINEX) 600 MG 12 hr tablet Take 600 mg by mouth 2 (two) times daily.   Yes Historical Provider, MD  Omega-3 Fatty Acids (FISH OIL) 1000 MG CAPS Take 1 capsule by mouth daily.     Yes  Historical Provider, MD  phenytoin (DILANTIN) 100 MG ER capsule Take 100 mg by mouth 3 (three) times daily.     Yes Historical Provider, MD  telmisartan (MICARDIS) 40 MG tablet Take 40 mg by mouth daily.     Yes Historical Provider, MD  vitamin B-12 (CYANOCOBALAMIN) 1000 MCG tablet Take 1,000 mcg by mouth daily.     Yes Historical Provider, MD  tiotropium (SPIRIVA) 18 MCG inhalation capsule Place 18 mcg into inhaler and inhale daily.      Historical Provider, MD    Allergies  Allergen Reactions  . Carbamazepine     REACTION: hives, itching  . Phenobarbital     REACTION: hives, itching  . Phenytoin     REACTION: generic - hives/itching  . Ramipril     REACTION: hives, itching  . Sulfonamide Derivatives     REACTION: hives, itching     Physical Exam:  General - No distress ENT - No sinus tenderness, no oral exudate, no LAN Cardiac - s1s2 regular, no murmur Chest - No wheeze/rales/dullness Back - No focal tenderness Abd - Soft, non-tender Ext - No edema Neuro - Normal strength Skin - No rashes Psych - normal mood, and behavior   Assessment/Plan:  Coralyn Helling, MD [place] [facility]/Critical Care/Sleep Pager:  [phone]

## 2013-03-25 NOTE — Patient Instructions (Signed)
Follow up in 6 months 

## 2013-03-25 NOTE — Assessment & Plan Note (Signed)
She has intermittent symptoms.  Advised her to continue prn proair, but can hold off on resuming spiriva for now.  Also discussed repeating spirometry and chest xray >> she would like to wait since she is not having much problem with her breathing at present.

## 2013-07-14 ENCOUNTER — Other Ambulatory Visit: Payer: Self-pay

## 2013-07-15 ENCOUNTER — Ambulatory Visit (INDEPENDENT_AMBULATORY_CARE_PROVIDER_SITE_OTHER): Payer: Medicare Other | Admitting: Adult Health

## 2013-07-15 ENCOUNTER — Encounter: Payer: Self-pay | Admitting: Adult Health

## 2013-07-15 ENCOUNTER — Ambulatory Visit (INDEPENDENT_AMBULATORY_CARE_PROVIDER_SITE_OTHER)
Admission: RE | Admit: 2013-07-15 | Discharge: 2013-07-15 | Disposition: A | Discharge status: Home / Self Care | Payer: Medicare Other | Source: Ambulatory Visit | Attending: Adult Health | Admitting: Adult Health

## 2013-07-15 VITALS — BP 150/92 | HR 111 | Temp 99.8°F | Ht 63.5 in | Wt 221.0 lb

## 2013-07-15 DIAGNOSIS — Z8709 Personal history of other diseases of the respiratory system: Secondary | ICD-10-CM

## 2013-07-15 DIAGNOSIS — R509 Fever, unspecified: Secondary | ICD-10-CM

## 2013-07-15 DIAGNOSIS — R05 Cough: Secondary | ICD-10-CM

## 2013-07-15 DIAGNOSIS — J449 Chronic obstructive pulmonary disease, unspecified: Secondary | ICD-10-CM

## 2013-07-15 MED ORDER — AMOXICILLIN-POT CLAVULANATE 875-125 MG PO TABS: 1.0000 | ORAL_TABLET | Freq: Two times a day (BID) | ORAL | Status: AC

## 2013-07-15 MED ORDER — PREDNISONE 10 MG PO TABS: ORAL_TABLET | ORAL | Status: DC

## 2013-07-15 NOTE — Patient Instructions (Signed)
Augmentin 875 mg twice daily for 7 days, take with food. Mucinex DM twice daily as needed. For cough and congestion. Prednisone taper over next week. Fluids and rest. Tylenol as needed. Follow up Dr. Craige Cotta  As planned and As needed   Please contact office for sooner follow up if symptoms do not improve or worsen or seek emergency care

## 2013-07-15 NOTE — Progress Notes (Signed)
Reviewed and agree with assessment/plan. 

## 2013-07-15 NOTE — Progress Notes (Signed)
  Subjective:    Patient ID: Meredith Marshall, female    DOB: December 19, 1940, 72 y.o.   MRN: [id number]  HPI 72 yo former smoker with COPD.  TESTS: PFT 06/04/09 >> FEV1 1.80(87%), FVC 2.96(103%), FEV1% 61, TLC 5.29(110%), DLCO 66%, no BD  07/15/2013 Acute OV  Complains of  HA, fever up to 102, weakness/fatigue, chest congestion, prod cough with dark gray w/ "little streaks of red every now and then", wheezing, chest tightness x3days. CXR today shows Large lung volumes, consistent with emphysematous changes. No focal infiltrate. Prominence of the interstitial markings.  Taking tylenol . No frank hemotpysis, chest pain ,, orthopnea, n/v/d, recent travel or abx use.     Review of Systems Constitutional:   No  weight loss, night sweats,   +Fevers, chills, fatigue, or  lassitude.  HEENT:   No headaches,  Difficulty swallowing,  Tooth/dental problems, or  Sore throat,                No sneezing, itching, ear ache,  +nasal congestion, post nasal drip,   CV:  No chest pain,  Orthopnea, PND, swelling in lower extremities, anasarca, dizziness, palpitations, syncope.   GI  No heartburn, indigestion, abdominal pain, nausea, vomiting, diarrhea, change in bowel habits, loss of appetite, bloody stools.   Resp:  .  No chest wall deformity  Skin: no rash or lesions.  GU: no dysuria, change in color of urine, no urgency or frequency.  No flank pain, no hematuria   MS:  No joint pain or swelling.  No decreased range of motion.  No back pain.  Psych:  No change in mood or affect. No depression or anxiety.  No memory loss.         Objective:   Physical Exam  GEN: A/Ox3; pleasant , NAD, elderly   HEENT:  [place]/AT,  EACs-clear, TMs-wnl, NOSE-clear, THROAT-clear, no lesions, no postnasal drip or exudate noted.   NECK:  Supple w/ fair ROM; no JVD; normal carotid impulses w/o bruits; no thyromegaly or nodules palpated; no lymphadenopathy.  RESP  Few faint exp wheezes no accessory muscle use, no  dullness to percussion  CARD:  RRR, no m/r/g  , no peripheral edema, pulses intact, no cyanosis or clubbing.  GI:   Soft & nt; nml bowel sounds; no organomegaly or masses detected.  Musco: Warm bil, no deformities or joint swelling noted.   Neuro: alert, no focal deficits noted.    Skin: Warm, no lesions or rashes        Assessment & Plan:

## 2013-07-15 NOTE — Assessment & Plan Note (Addendum)
Flare with bronchitis  CXR chronic changes, no PNA    Plan  Augmentin 875 mg twice daily for 7 days, take with food. Mucinex DM twice daily as needed. For cough and congestion. Prednisone taper over next week. Fluids and rest. Tylenol as needed. Follow up Dr. Craige Cotta  As planned and As needed   Please contact office for sooner follow up if symptoms do not improve or worsen or seek emergency care

## 2013-07-21 ENCOUNTER — Other Ambulatory Visit: Payer: Self-pay | Admitting: Dermatology

## 2013-09-08 DIAGNOSIS — C44310 Basal cell carcinoma of skin of unspecified parts of face: Secondary | ICD-10-CM

## 2013-09-08 HISTORY — DX: Basal cell carcinoma of skin of unspecified parts of face: C44.310

## 2013-09-21 ENCOUNTER — Encounter: Payer: Self-pay | Admitting: Pulmonary Disease

## 2013-09-21 ENCOUNTER — Ambulatory Visit (INDEPENDENT_AMBULATORY_CARE_PROVIDER_SITE_OTHER): Payer: Medicare Other | Admitting: Pulmonary Disease

## 2013-09-21 VITALS — BP 130/68 | HR 96 | Temp 97.9°F | Ht 64.0 in | Wt 223.0 lb

## 2013-09-21 DIAGNOSIS — J449 Chronic obstructive pulmonary disease, unspecified: Secondary | ICD-10-CM

## 2013-09-21 NOTE — Assessment & Plan Note (Signed)
Stable off inhaler therapy at present.  She can use albuterol as needed.  Only concern is that she gets frequent exacerbations necessitating antibiotics and prednisone.  May need to reconsider maintenance inhaler therapy at some point.

## 2013-09-21 NOTE — Patient Instructions (Signed)
Follow up in 6 months 

## 2013-09-21 NOTE — Progress Notes (Signed)
Chief Complaint  Patient presents with  . COPD    Breathing is unchanged. Reports SOB, slight chest tightness and coughing. Denies wheezing.    History of Present Illness: Meredith Marshall is a 73 y.o. female former smoker with COPD.  She has been doing okay recently.  She was treated for an exacerbation in November 2014.  She was given sample of Breo by PCP in December.  She is not using any inhalers at present.  She is not having cough, wheeze, or sputum.  She had skin removed from her nose earlier this month.  TESTS: PFT 06/04/09 >> FEV1 1.80(87%), FVC 2.96(103%), FEV1% 61, TLC 5.29(110%), DLCO 66%, no BD Spirometry 09/21/13 >> FEV1 1.49 (70%), FEV1% [place]  has a past medical history of GERD (gastroesophageal reflux disease); Barrett esophagus; Hemorrhoids; Seizure; Tobacco abuse; Chronic cystic mastitis (1971); Cervical spine disease; Thyroiditis (1989); Osteoporosis; and COPD (chronic obstructive pulmonary disease).  [name]  has past surgical history that includes Tonsillectomy; Cesarean section; and Breast biopsy.  Prior to Admission medications   Medication Sig Start Date End Date Taking? Authorizing Provider  acetaminophen (TYLENOL) 500 MG tablet Take by mouth as needed.     Yes Historical Provider, MD  albuterol (PROAIR HFA) 108 (90 BASE) MCG/ACT inhaler Inhale 2 puffs into the lungs every 6 (six) hours as needed.     Yes Historical Provider, MD  atorvastatin (LIPITOR) 20 MG tablet Take 20 mg by mouth at bedtime.     Yes Historical Provider, MD  Cholecalciferol (VITAMIN D) 1000 UNITS capsule Take 1,000 Units by mouth daily.     Yes Historical Provider, MD  DEXILANT 60 MG capsule Once a day 01/28/12  Yes Historical Provider, MD  guaiFENesin (MUCINEX) 600 MG 12 hr tablet Take 600 mg by mouth 2 (two) times daily.   Yes Historical Provider, MD  Omega-3 Fatty Acids (FISH OIL) 1000 MG CAPS Take 1 capsule by mouth daily.     Yes Historical Provider, MD  phenytoin  (DILANTIN) 100 MG ER capsule Take 100 mg by mouth 3 (three) times daily.     Yes Historical Provider, MD  telmisartan (MICARDIS) 40 MG tablet Take 40 mg by mouth daily.     Yes Historical Provider, MD  vitamin B-12 (CYANOCOBALAMIN) 1000 MCG tablet Take 1,000 mcg by mouth daily.     Yes Historical Provider, MD  tiotropium (SPIRIVA) 18 MCG inhalation capsule Place 18 mcg into inhaler and inhale daily.      Historical Provider, MD    Allergies  Allergen Reactions  . Carbamazepine     REACTION: hives, itching  . Phenobarbital     REACTION: hives, itching  . Phenytoin     REACTION: generic - hives/itching.  Pt reports she unable to tolerate the GENERIC but does well with the [facility] name  . Ramipril     REACTION: hives, itching  . Sulfonamide Derivatives     REACTION: hives, itching     Physical Exam:  General - No distress ENT - No sinus tenderness, no oral exudate, no LAN Cardiac - s1s2 regular, no murmur Chest - No wheeze/rales/dullness Back - No focal tenderness Abd - Soft, non-tender Ext - No edema Neuro - Normal strength Skin - No rashes Psych - normal mood, and behavior   Assessment/Plan:  Chesley Mires, MD [place] [facility]/Critical Care/Sleep Pager:  [phone]

## 2014-02-20 ENCOUNTER — Encounter: Payer: Self-pay | Admitting: Gastroenterology

## 2014-02-23 ENCOUNTER — Telehealth: Payer: Self-pay | Admitting: *Deleted

## 2014-02-23 ENCOUNTER — Ambulatory Visit (AMBULATORY_SURGERY_CENTER): Payer: Self-pay | Admitting: *Deleted

## 2014-02-23 VITALS — Ht 64.0 in | Wt 227.8 lb

## 2014-02-23 DIAGNOSIS — Z1211 Encounter for screening for malignant neoplasm of colon: Secondary | ICD-10-CM

## 2014-02-23 MED ORDER — MOVIPREP 100 G PO SOLR: ORAL | Status: DC

## 2014-02-23 NOTE — Telephone Encounter (Signed)
Dr Ardis Hughs: pt is scheduled for direct colonoscopy 6/29.  Last colonoscopy 2004 with Dr Lajoyce Corners.  Pt also had EGD 2004 and 2006 with Dr. Lajoyce Corners for GERD.  All reports and pathology reports are in EPIC.  During PV pt says she has been taking either Nexium or Dexilant on and off since 2006.  She in not currently having any GERD symptoms; last time she took Dexilant was 2 months ago.  Does she need EGD at time of colonoscopy?  Thanks, Juliann Pulse

## 2014-02-23 NOTE — Progress Notes (Signed)
No allergies to eggs or soy. No problems with anesthesia.  Pt given Emmi instructions for colonoscopy  No oxygen use  No diet drug use  

## 2014-02-23 NOTE — Telephone Encounter (Signed)
Pt notified need for EGD/Colon.  Rescheduled to 6/29 at 3:00.  Updated prep instructions reviewed with pt.

## 2014-02-23 NOTE — Telephone Encounter (Signed)
2003 EGD showed no Barrett's on path 2006 EGD showed "rare focus of IM (Barrett's)"  Yes she should have repeat EGD, please ensure there is available time for a double procedure.  Thanks

## 2014-03-06 ENCOUNTER — Encounter: Payer: Medicare Other | Admitting: Gastroenterology

## 2014-03-06 ENCOUNTER — Ambulatory Visit (AMBULATORY_SURGERY_CENTER): Payer: Medicare Other | Admitting: Gastroenterology

## 2014-03-06 ENCOUNTER — Encounter: Payer: Self-pay | Admitting: Gastroenterology

## 2014-03-06 VITALS — BP 130/65 | HR 84 | Temp 97.9°F | Resp 17 | Ht 64.0 in | Wt 227.0 lb

## 2014-03-06 DIAGNOSIS — R933 Abnormal findings on diagnostic imaging of other parts of digestive tract: Secondary | ICD-10-CM

## 2014-03-06 DIAGNOSIS — Z1211 Encounter for screening for malignant neoplasm of colon: Secondary | ICD-10-CM

## 2014-03-06 DIAGNOSIS — D126 Benign neoplasm of colon, unspecified: Secondary | ICD-10-CM

## 2014-03-06 DIAGNOSIS — K449 Diaphragmatic hernia without obstruction or gangrene: Secondary | ICD-10-CM

## 2014-03-06 DIAGNOSIS — Z8719 Personal history of other diseases of the digestive system: Secondary | ICD-10-CM

## 2014-03-06 DIAGNOSIS — K219 Gastro-esophageal reflux disease without esophagitis: Secondary | ICD-10-CM

## 2014-03-06 MED ORDER — SODIUM CHLORIDE 0.9 % IV SOLN: 500.0000 mL | INTRAVENOUS | Status: DC

## 2014-03-06 MED ORDER — ESOMEPRAZOLE MAGNESIUM 40 MG PO PACK: 40.0000 mg | PACK | Freq: Every day | ORAL | Status: DC

## 2014-03-06 NOTE — Patient Instructions (Signed)
Discharge instructions given with verbal understanding. Biopsies taken. Handout on polyp. Resume previous medications. YOU HAD AN ENDOSCOPIC PROCEDURE TODAY AT [facility]: Refer to the procedure report that was given to you for any specific questions about what was found during the examination.  If the procedure report does not answer your questions, please call your gastroenterologist to clarify.  If you requested that your care partner not be given the details of your procedure findings, then the procedure report has been included in a sealed envelope for you to review at your convenience later.  YOU SHOULD EXPECT: Some feelings of bloating in the abdomen. Passage of more gas than usual.  Walking can help get rid of the air that was put into your GI tract during the procedure and reduce the bloating. If you had a lower endoscopy (such as a colonoscopy or flexible sigmoidoscopy) you may notice spotting of blood in your stool or on the toilet paper. If you underwent a bowel prep for your procedure, then you may not have a normal bowel movement for a few days.  DIET: Your first meal following the procedure should be a light meal and then it is ok to progress to your normal diet.  A half-sandwich or bowl of soup is an example of a good first meal.  Heavy or fried foods are harder to digest and may make you feel nauseous or bloated.  Likewise meals heavy in dairy and vegetables can cause extra gas to form and this can also increase the bloating.  Drink plenty of fluids but you should avoid alcoholic beverages for 24 hours.  ACTIVITY: Your care partner should take you home directly after the procedure.  You should plan to take it easy, moving slowly for the rest of the day.  You can resume normal activity the day after the procedure however you should NOT DRIVE or use heavy machinery for 24 hours (because of the sedation medicines used during the test).    SYMPTOMS TO REPORT IMMEDIATELY: A  gastroenterologist can be reached at any hour.  During normal business hours, 8:30 AM to 5:00 PM Monday through Friday, call [phone].  After hours and on weekends, please call the GI answering service at [phone] who will take a message and have the physician on call contact you.   Following lower endoscopy (colonoscopy or flexible sigmoidoscopy):  Excessive amounts of blood in the stool  Significant tenderness or worsening of abdominal pains  Swelling of the abdomen that is new, acute  Fever of 100F or higher  Following upper endoscopy (EGD)  Vomiting of blood or coffee ground material  New chest pain or pain under the shoulder blades  Painful or persistently difficult swallowing  New shortness of breath  Fever of 100F or higher  Black, tarry-looking stools  FOLLOW UP: If any biopsies were taken you will be contacted by phone or by letter within the next 1-3 weeks.  Call your gastroenterologist if you have not heard about the biopsies in 3 weeks.  Our staff will call the home number listed on your records the next business day following your procedure to check on you and address any questions or concerns that you may have at that time regarding the information given to you following your procedure. This is a courtesy call and so if there is no answer at the home number and we have not heard from you through the emergency physician on call, we will assume that you have  returned to your regular daily activities without incident.  SIGNATURES/CONFIDENTIALITY: You and/or your care partner have signed paperwork which will be entered into your electronic medical record.  These signatures attest to the fact that that the information above on your After Visit Summary has been reviewed and is understood.  Full responsibility of the confidentiality of this discharge information lies with you and/or your care-partner.

## 2014-03-06 NOTE — Progress Notes (Signed)
Called to room to assist during endoscopic procedure.  Patient ID and intended procedure confirmed with present staff. Received instructions for my participation in the procedure from the performing physician.  

## 2014-03-06 NOTE — Op Note (Signed)
[place]  [address] [place], [id number]   COLONOSCOPY PROCEDURE REPORT  PATIENT: Meredith Marshall, Meredith Marshall  MR#: [id number] BIRTHDATE: [birth year] , 73  yrs. old GENDER: Female ENDOSCOPIST: Milus Banister, MD REFERRED GG:YIRS Perini, M.D. PROCEDURE DATE:  03/06/2014 PROCEDURE:   Colonoscopy with snare polypectomy and Submucosal injection, any substance First Screening Colonoscopy - Avg.  risk and is 50 yrs.  old or older - No.  Prior Negative Screening - Now for repeat screening. 10 or more years since last screening  History of Adenoma - Now for follow-up colonoscopy & has been > or = to 3 yrs.  N/A  Polyps Removed Today? Yes. ASA CLASS:   Class II INDICATIONS:average risk screening. MEDICATIONS: MAC sedation, administered by CRNA and Propofol (Diprivan) 340 mg IV  DESCRIPTION OF PROCEDURE:   After the risks benefits and alternatives of the procedure were thoroughly explained, informed consent was obtained.  A digital rectal exam revealed no abnormalities of the rectum.   The LB [id number]  endoscope was introduced through the anus and advanced to the cecum, which was identified by both the appendix and ileocecal valve. No adverse events experienced.   The quality of the prep was excellent.  The instrument was then slowly withdrawn as the colon was fully examined.  COLON FINDINGS: Three polyps were found, removed and sent to pathology.  One was sessile, 3cm across, located at hepatic flexure, removed piecemeal fashion with snare and cautery and then the site was labeled with SPOT following removal (path jar 1).  One was 72mm across, at hepatic flexure as well, sessile, removed with cold snare, (jar jar 1).  The last was sessile, 27mm across, located in transverse segment, removed with cold snare (path jar 2).  The examination was otherwise normal.  Retroflexed views revealed no abnormalities. The time to cecum=4 minutes 11 seconds.  Withdrawal time=18  minutes 39 seconds.  The scope was withdrawn and the procedure completed. COMPLICATIONS: There were no complications.  ENDOSCOPIC IMPRESSION: Three polyps were found, removed and sent to pathology.  One was sessile, 3cm across, located at hepatic flexure, removed piecemeal fashion with snare and cautery and then the site was labeled with SPOT following removal (path jar 1).  One was 44mm across, at hepatic flexure as well, sessile, removed with cold snare, (jar jar 1).  The last was sessile, 58mm across, located in transverse segment, removed with cold snare (path jar 2).  The examination was otherwise normal.  RECOMMENDATIONS: Await pathology results   eSigned:  Milus Banister, MD 03/06/2014 3:18 PM

## 2014-03-06 NOTE — Op Note (Signed)
[place]  [address] [place], [id number]   ENDOSCOPY PROCEDURE REPORT  PATIENT: Meredith, Marshall  MR#: [id number] BIRTHDATE: 1941/06/09 , 73  yrs. old GENDER: Female ENDOSCOPIST: Milus Banister, MD PROCEDURE DATE:  03/06/2014 PROCEDURE:  EGD w/ biopsy ASA CLASS:     Class II INDICATIONS:  Dr.  Lajoyce Corners EGD 2003, 2006 (one showed focal IM the other did not).. MEDICATIONS: MAC sedation, administered by CRNA and Propofol (Diprivan) 90 mg IV TOPICAL ANESTHETIC: none  DESCRIPTION OF PROCEDURE: After the risks benefits and alternatives of the procedure were thoroughly explained, informed consent was obtained.  The LB [id number] endoscope was introduced through the mouth and advanced to the second portion of the duodenum. Without limitations.  The instrument was slowly withdrawn as the mucosa was fully examined.   There was mild reflex related esophagitis at the GE juntion, mucosa was edematous but not clearly adenomatous.  This was biopsied and sent to pathology.  There was a 2cm hiatal hernia.  The examination was otherwise normal.  Retroflexed views revealed no abnormalities. The scope was then withdrawn from the patient and the procedure completed.  COMPLICATIONS: There were no complications. ENDOSCOPIC IMPRESSION: There was mild reflex related esophagitis at the GE juntion, mucosa was edematous but not clearly adenomatous.  This was biopsied and sent to pathology.  There was a 2cm hiatal hernia.  The examination was otherwise normal.  RECOMMENDATIONS: Await final pathology. Your esophagus is clearly irritated from acid damage, you should resume once daily nexium (20-30 min prior to BF meal daily). New script was called in today. REPEAT EXAM:  eSigned:  Milus Banister, MD 03/06/2014 3:22 PM   CC: Crist Infante, MD

## 2014-03-06 NOTE — Progress Notes (Signed)
Procedure ends, to recovery, report given and VSS. 

## 2014-03-07 ENCOUNTER — Telehealth: Payer: Self-pay | Admitting: *Deleted

## 2014-03-07 NOTE — Telephone Encounter (Signed)
  Follow up Call-  Call back number 03/06/2014  Post procedure Call Back phone  # [phone]  Permission to leave phone message Yes     Patient questions:  Do you have a fever, pain , or abdominal swelling? No. Pain Score  0 *  Have you tolerated food without any problems? Yes.    Have you been able to return to your normal activities? Yes.    Do you have any questions about your discharge instructions: Diet   No. Medications  No. Follow up visit  No.  Do you have questions or concerns about your Care? No.  Actions: * If pain score is 4 or above: No action needed, pain <4.

## 2014-05-31 ENCOUNTER — Encounter: Payer: Self-pay | Admitting: Gastroenterology

## 2014-06-05 ENCOUNTER — Encounter: Payer: Self-pay | Admitting: Gastroenterology

## 2014-08-08 ENCOUNTER — Ambulatory Visit (AMBULATORY_SURGERY_CENTER): Payer: Self-pay

## 2014-08-08 VITALS — Ht 64.0 in | Wt 230.0 lb

## 2014-08-08 DIAGNOSIS — Z86010 Personal history of colonic polyps: Secondary | ICD-10-CM

## 2014-08-08 MED ORDER — MOVIPREP 100 G PO SOLR: ORAL | Status: DC

## 2014-08-08 NOTE — Progress Notes (Signed)
Per pt, no allergies to soy or egg products.Pt not taking any weight loss meds or using  O2 at home. 

## 2014-08-15 ENCOUNTER — Ambulatory Visit (AMBULATORY_SURGERY_CENTER): Payer: Medicare Other | Admitting: Gastroenterology

## 2014-08-15 ENCOUNTER — Encounter: Payer: Self-pay | Admitting: Gastroenterology

## 2014-08-15 VITALS — BP 145/79 | HR 77 | Temp 99.0°F | Resp 21 | Ht 64.0 in | Wt 230.0 lb

## 2014-08-15 DIAGNOSIS — D123 Benign neoplasm of transverse colon: Secondary | ICD-10-CM

## 2014-08-15 DIAGNOSIS — Z86010 Personal history of colonic polyps: Secondary | ICD-10-CM

## 2014-08-15 MED ORDER — SODIUM CHLORIDE 0.9 % IV SOLN: 500.0000 mL | INTRAVENOUS | Status: DC

## 2014-08-15 NOTE — Patient Instructions (Signed)
YOU HAD AN ENDOSCOPIC PROCEDURE TODAY AT [facility]: Refer to the procedure report that was given to you for any specific questions about what was found during the examination.  If the procedure report does not answer your questions, please call your gastroenterologist to clarify.  If you requested that your care partner not be given the details of your procedure findings, then the procedure report has been included in a sealed envelope for you to review at your convenience later.  YOU SHOULD EXPECT: Some feelings of bloating in the abdomen. Passage of more gas than usual.  Walking can help get rid of the air that was put into your GI tract during the procedure and reduce the bloating. If you had a lower endoscopy (such as a colonoscopy or flexible sigmoidoscopy) you may notice spotting of blood in your stool or on the toilet paper. If you underwent a bowel prep for your procedure, then you may not have a normal bowel movement for a few days.  DIET: Your first meal following the procedure should be a light meal and then it is ok to progress to your normal diet.  A half-sandwich or bowl of soup is an example of a good first meal.  Heavy or fried foods are harder to digest and may make you feel nauseous or bloated.  Likewise meals heavy in dairy and vegetables can cause extra gas to form and this can also increase the bloating.  Drink plenty of fluids but you should avoid alcoholic beverages for 24 hours.  ACTIVITY: Your care partner should take you home directly after the procedure.  You should plan to take it easy, moving slowly for the rest of the day.  You can resume normal activity the day after the procedure however you should NOT DRIVE or use heavy machinery for 24 hours (because of the sedation medicines used during the test).    SYMPTOMS TO REPORT IMMEDIATELY: A gastroenterologist can be reached at any hour.  During normal business hours, 8:30 AM to 5:00 PM Monday through Friday,  call [phone].  After hours and on weekends, please call the GI answering service at [phone] who will take a message and have the physician on call contact you.   Following lower endoscopy (colonoscopy or flexible sigmoidoscopy):  Excessive amounts of blood in the stool  Significant tenderness or worsening of abdominal pains  Swelling of the abdomen that is new, acute  Fever of 100F or higher  FOLLOW UP: If any biopsies were taken you will be contacted by phone or by letter within the next 1-3 weeks.  Call your gastroenterologist if you have not heard about the biopsies in 3 weeks.  Our staff will call the home number listed on your records the next business day following your procedure to check on you and address any questions or concerns that you may have at that time regarding the information given to you following your procedure. This is a courtesy call and so if there is no answer at the home number and we have not heard from you through the emergency physician on call, we will assume that you have returned to your regular daily activities without incident.  SIGNATURES/CONFIDENTIALITY: You and/or your care partner have signed paperwork which will be entered into your electronic medical record.  These signatures attest to the fact that that the information above on your After Visit Summary has been reviewed and is understood.  Full responsibility of the confidentiality of this   discharge information lies with you and/or your care-partner.  Recommendations Next colonoscopy determined by pathology results,  Polyp handout provided to patient/care partner.

## 2014-08-15 NOTE — Progress Notes (Signed)
Procedure ends, to recovery, report given and VSS. 

## 2014-08-15 NOTE — Progress Notes (Signed)
Called to room to assist during endoscopic procedure.  Patient ID and intended procedure confirmed with present staff. Received instructions for my participation in the procedure from the performing physician.  

## 2014-08-16 ENCOUNTER — Telehealth: Payer: Self-pay

## 2014-08-16 NOTE — Op Note (Signed)
[place]  [address]. [place], [id number]   COLONOSCOPY PROCEDURE REPORT  PATIENT: Meredith Marshall, Meredith Marshall  MR#: [id number] BIRTHDATE: November 29, 1940 , 79  yrs. old GENDER: female ENDOSCOPIST: Milus Banister, MD PROCEDURE DATE:  08/15/2014 PROCEDURE:   Colonoscopy with biopsy First Screening Colonoscopy - Avg.  risk and is 50 yrs.  old or older - No.  Prior Negative Screening - Now for repeat screening. N/A  History of Adenoma - Now for follow-up colonoscopy & has been > or = to 3 yrs.  No.  It has been less than 3 yrs since last colonoscopy.  Medical reason.  Polyps Removed Today? No.  Recommend repeat exam, <10 yrs? Yes.  High risk (family or personal hx). ASA CLASS:   Class II INDICATIONS:Colonoscopy 6 months ago, 3cm polyp removed from hepatic flexure using piecemeal technique.  The site was labled with Spot following resection.[name] Kitchen MEDICATIONS: Monitored anesthesia care and Propofol 200 mg IV  DESCRIPTION OF PROCEDURE:   After the risks benefits and alternatives of the procedure were thoroughly explained, informed consent was obtained.  The digital rectal exam revealed no abnormalities of the rectum.   The LB [id number]  endoscope was introduced through the anus and advanced to the cecum, which was identified by both the appendix and ileocecal valve. No adverse events experienced.   The quality of the prep was excellent.  The instrument was then slowly withdrawn as the colon was fully examined.  COLON FINDINGS: The site of previous (02/2014) piecemeal polypectomy was clearly located with aid of the previous Spot injection.  There was no residual or recurrent polypoid changes.  One polyp was found, removed and sent to pathology.  This was 1-28mm, sessile, located in transverse segment, removed with biopy.  The examination was otherwise normal.  Retroflexed views revealed no abnormalities. The time to cecum=3 minutes 06 seconds.  Withdrawal time=7 minutes 28  seconds.  The scope was withdrawn and the procedure completed. COMPLICATIONS: There were no immediate complications. ENDOSCOPIC IMPRESSION: The site of previous (02/2014) piecemeal polypectomy was clearly located with aid of the previous Spot injection.  There was no residual or recurrent polypoid changes. One polyp was found, removed and sent to pathology. The examination was otherwise normal  RECOMMENDATIONS: If the polyp(s) removed today are proven to be adenomatous (pre-cancerous) polyps, you will need a colonoscopy in 3 years. You will receive a letter within 1-2 weeks with the results of your biopsy as well as final recommendations.  Please call my office if you have not received a letter after 3 weeks.  eSigned:  Milus Banister, MD 08/15/2014 10:53 AM   cc: Madelyn Brunner, MD.

## 2014-08-16 NOTE — Telephone Encounter (Signed)
  Follow up Call-  Call back number 08/15/2014 03/06/2014  Post procedure Call Back phone  # [phone]  Permission to leave phone message Yes Yes     Patient questions:  Do you have a fever, pain , or abdominal swelling? No. Pain Score  0 *  Have you tolerated food without any problems? Yes.    Have you been able to return to your normal activities? Yes.    Do you have any questions about your discharge instructions: Diet   No. Medications  No. Follow up visit  No.  Do you have questions or concerns about your Care? No.  Actions: * If pain score is 4 or above: No action needed, pain <4.  No problems per the pt. maw

## 2014-08-22 ENCOUNTER — Encounter: Payer: Self-pay | Admitting: Gastroenterology

## 2014-09-18 ENCOUNTER — Other Ambulatory Visit: Payer: Self-pay | Admitting: Internal Medicine

## 2014-09-18 DIAGNOSIS — Z0000 Encounter for general adult medical examination without abnormal findings: Secondary | ICD-10-CM

## 2014-09-21 ENCOUNTER — Ambulatory Visit
Admission: RE | Admit: 2014-09-21 | Discharge: 2014-09-21 | Disposition: A | Discharge status: Home / Self Care | Payer: Medicare Other | Source: Ambulatory Visit | Attending: Internal Medicine | Admitting: Internal Medicine

## 2014-09-21 DIAGNOSIS — Z0000 Encounter for general adult medical examination without abnormal findings: Secondary | ICD-10-CM

## 2014-09-27 ENCOUNTER — Other Ambulatory Visit: Payer: Self-pay | Admitting: Dermatology

## 2014-12-22 ENCOUNTER — Other Ambulatory Visit (HOSPITAL_COMMUNITY): Payer: Self-pay | Admitting: Orthopedic Surgery

## 2014-12-22 ENCOUNTER — Ambulatory Visit (HOSPITAL_COMMUNITY)
Admission: RE | Admit: 2014-12-22 | Discharge: 2014-12-22 | Disposition: A | Discharge status: Home / Self Care | Payer: Medicare Other | Source: Ambulatory Visit | Attending: Orthopedic Surgery | Admitting: Orthopedic Surgery

## 2014-12-22 DIAGNOSIS — Z01818 Encounter for other preprocedural examination: Secondary | ICD-10-CM | POA: Insufficient documentation

## 2014-12-22 DIAGNOSIS — M25562 Pain in left knee: Secondary | ICD-10-CM

## 2015-08-21 ENCOUNTER — Ambulatory Visit (INDEPENDENT_AMBULATORY_CARE_PROVIDER_SITE_OTHER): Payer: Medicare Other | Admitting: Adult Health

## 2015-08-21 ENCOUNTER — Encounter: Payer: Self-pay | Admitting: Adult Health

## 2015-08-21 ENCOUNTER — Ambulatory Visit (INDEPENDENT_AMBULATORY_CARE_PROVIDER_SITE_OTHER)
Admission: RE | Admit: 2015-08-21 | Discharge: 2015-08-21 | Disposition: A | Discharge status: Home / Self Care | Payer: Medicare Other | Source: Ambulatory Visit | Attending: Adult Health | Admitting: Adult Health

## 2015-08-21 VITALS — BP 138/82 | HR 97 | Ht 64.0 in | Wt 234.0 lb

## 2015-08-21 DIAGNOSIS — J441 Chronic obstructive pulmonary disease with (acute) exacerbation: Secondary | ICD-10-CM

## 2015-08-21 MED ORDER — PREDNISONE 10 MG PO TABS: ORAL_TABLET | ORAL | Status: DC

## 2015-08-21 MED ORDER — AMOXICILLIN-POT CLAVULANATE 875-125 MG PO TABS: 1.0000 | ORAL_TABLET | Freq: Two times a day (BID) | ORAL | Status: AC

## 2015-08-21 MED ORDER — TIOTROPIUM BROMIDE MONOHYDRATE 18 MCG IN CAPS: 18.0000 ug | ORAL_CAPSULE | Freq: Every day | RESPIRATORY_TRACT | Status: DC

## 2015-08-21 NOTE — Patient Instructions (Signed)
Augmentin 875 mg twice daily for 7 days, take with food. Mucinex DM twice daily as needed. For cough and congestion. Prednisone taper over next week. Chest xray today .  Restart Spiriva daily .  Follow up Dr. Halford Chessman  3 months and and As needed   Please contact office for sooner follow up if symptoms do not improve or worsen or seek emergency care

## 2015-08-21 NOTE — Addendum Note (Signed)
Addended by: Osa Craver on: 08/21/2015 11:30 AM   Modules accepted: Orders

## 2015-08-21 NOTE — Progress Notes (Signed)
Reviewed and agree with assessment/plan. 

## 2015-08-21 NOTE — Assessment & Plan Note (Signed)
Flare   Plan  Augmentin 875 mg twice daily for 7 days, take with food. Mucinex DM twice daily as needed. For cough and congestion. Prednisone taper over next week. Chest xray today .  Restart Spiriva daily .  Follow up Dr. Halford Chessman  3 months and and As needed   Please contact office for sooner follow up if symptoms do not improve or worsen or seek emergency care

## 2015-08-21 NOTE — Progress Notes (Signed)
Subjective:    Patient ID: Meredith Marshall, female    DOB: 01-10-1941, 74 y.o.   MRN: [id number]  HPI  74 yo former smoker with COPD.  TESTS: PFT 06/04/09 >> FEV1 1.80(87%), FVC 2.96(103%), FEV1% 61, TLC 5.29(110%), DLCO 66%, no BD  08/21/2015 Acute OV : COPD  Pt presents for an acute office visit.  Complains of 2 weeks of cough , green mucus and wheezing  Says cough and congestion is getting worse . Pain with coughing now.  Has been off of Spiriva for long time, would like to restart. Feels her breathing was better on this .   [name] Kitchen No hemotpysis, chest pain ,, orthopnea, n/v/d, recent travel or abx use.   Past Medical History  Diagnosis Date  . GERD (gastroesophageal reflux disease)   . Barrett esophagus     Questionable?  . Hemorrhoids   . 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 Tobacco abuse   . Chronic cystic mastitis 1971  . Cervical spine disease   . Thyroiditis 1989  . Osteoporosis   . COPD (chronic obstructive pulmonary disease) ([place])   . Hyperlipidemia   . Hypertension   . 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 acetaminophen (TYLENOL) 500 MG tablet Take by mouth as needed.      [name] Kitchen atorvastatin (LIPITOR) 20 MG tablet Take 20 mg by mouth at bedtime.      [name] Kitchen b complex vitamins tablet Take 1 tablet by mouth daily.    . phenytoin (DILANTIN) 100 MG ER capsule Take 100 mg by mouth 3 (three) times daily.      [name] Kitchen telmisartan (MICARDIS) 40 MG tablet Take 40 mg by mouth daily.      [name] Kitchen albuterol (PROAIR HFA) 108 (90 BASE) MCG/ACT inhaler Inhale 2 puffs into the lungs every 6 (six) hours as needed.      . Cholecalciferol (VITAMIN D) 1000 UNITS capsule Take 2,000 Units by mouth.     . esomeprazole (NEXIUM) 40 MG packet Take 40 mg by mouth daily before breakfast. (Patient not taking: Reported on 08/21/2015) 30 each 12  . Omega-3 Fatty Acids (FISH OIL) 1000 MG CAPS  Take 1 capsule by mouth daily.      . [DISCONTINUED] DEXILANT 60 MG capsule Take 60 mg by mouth daily as needed.      No current facility-administered medications on file prior to visit.     Review of Systems  Constitutional:   No  weight loss, night sweats,   +Fevers, chills, fatigue, or  lassitude.  HEENT:   No headaches,  Difficulty swallowing,  Tooth/dental problems, or  Sore throat,                No sneezing, itching, ear ache,  +nasal congestion, post nasal drip,   CV:  No chest pain,  Orthopnea, PND, swelling in lower extremities, anasarca, dizziness, palpitations, syncope.   GI  No heartburn, indigestion, abdominal pain, nausea, vomiting, diarrhea, change in bowel habits, loss of appetite, bloody stools.   Resp:  .  No chest wall deformity  Skin: no rash or lesions.  GU: no dysuria, change in color of urine, no urgency or frequency.  No flank pain, no hematuria   MS:  No joint pain or swelling.  No decreased range of motion.  No back pain.  Psych:  No change  in mood or affect. No depression or anxiety.  No memory loss.         Objective:   Physical Exam  Filed Vitals:   08/21/15 1103  BP: 138/82  Pulse: 97  Height: 5\' 4"  (1.626 m)  Weight: 234 lb (106.142 kg)  SpO2: 97%    GEN: A/Ox3; pleasant , NAD, elderly   HEENT:  [place]/AT,  EACs-clear, TMs-wnl, NOSE-clear, THROAT-clear, no lesions, no postnasal drip or exudate noted.   NECK:  Supple w/ fair ROM; no JVD; normal carotid impulses w/o bruits; no thyromegaly or nodules palpated; no lymphadenopathy.  RESP  Few faint exp wheezes no accessory muscle use, no dullness to percussion  CARD:  RRR, no m/r/g  , no peripheral edema, pulses intact, no cyanosis or clubbing.  GI:   Soft & nt; nml bowel sounds; no organomegaly or masses detected.  Musco: Warm bil, no deformities or joint swelling noted.   Neuro: alert, no focal deficits noted.    Skin: Warm, no lesions or rashes        Assessment & Plan:

## 2015-11-26 ENCOUNTER — Other Ambulatory Visit: Payer: Self-pay | Admitting: Otolaryngology

## 2015-11-26 DIAGNOSIS — H903 Sensorineural hearing loss, bilateral: Secondary | ICD-10-CM

## 2015-12-05 ENCOUNTER — Ambulatory Visit
Admission: RE | Admit: 2015-12-05 | Discharge: 2015-12-05 | Disposition: A | Discharge status: Home / Self Care | Payer: Medicare Other | Source: Ambulatory Visit | Attending: Otolaryngology | Admitting: Otolaryngology

## 2015-12-05 DIAGNOSIS — H903 Sensorineural hearing loss, bilateral: Secondary | ICD-10-CM

## 2015-12-05 MED ORDER — GADOBENATE DIMEGLUMINE 529 MG/ML IV SOLN
20.0000 mL | Freq: Once | INTRAVENOUS | Status: AC | PRN
  Administered 2015-12-05: 20 mL via INTRAVENOUS

## 2015-12-26 ENCOUNTER — Ambulatory Visit (INDEPENDENT_AMBULATORY_CARE_PROVIDER_SITE_OTHER): Payer: Medicare Other | Admitting: Pulmonary Disease

## 2015-12-26 ENCOUNTER — Encounter: Payer: Self-pay | Admitting: Pulmonary Disease

## 2015-12-26 VITALS — BP 132/74 | HR 90 | Ht 63.75 in | Wt 232.2 lb

## 2015-12-26 DIAGNOSIS — J432 Centrilobular emphysema: Secondary | ICD-10-CM | POA: Diagnosis not present

## 2015-12-26 DIAGNOSIS — J438 Other emphysema: Secondary | ICD-10-CM | POA: Diagnosis not present

## 2015-12-26 NOTE — Progress Notes (Signed)
Current Outpatient Prescriptions on File Prior to Visit  Medication Sig  . acetaminophen (TYLENOL) 500 MG tablet Take by mouth as needed.    [name] Kitchen albuterol (PROAIR HFA) 108 (90 BASE) MCG/ACT inhaler Inhale 2 puffs into the lungs every 6 (six) hours as needed.    [name] Kitchen atorvastatin (LIPITOR) 20 MG tablet Take 20 mg by mouth at bedtime.    [name] Kitchen b complex vitamins tablet Take 1 tablet by mouth daily.  . Cholecalciferol (VITAMIN D) 1000 UNITS capsule Take 2,000 Units by mouth.   . phenytoin (DILANTIN) 100 MG ER capsule Take 100 mg by mouth 3 (three) times daily.    [name] Kitchen telmisartan (MICARDIS) 40 MG tablet Take 40 mg by mouth daily.    [name] Kitchen tiotropium (SPIRIVA) 18 MCG inhalation capsule Place 1 capsule (18 mcg total) into inhaler and inhale daily.  . [DISCONTINUED] DEXILANT 60 MG capsule Take 60 mg by mouth daily as needed.    No current facility-administered medications on file prior to visit.    Chief Complaint  Patient presents with  . Follow-up    Pt c/o some wheezing and SOB. Would like rerferral to go back to Pulm Rehab    Tests PFT 06/04/09 >> FEV1 1.80(87%), FVC 2.96(103%), FEV1% 61, TLC 5.29(110%), DLCO 66%, no BD Spirometry 09/21/13 >> FEV1 1.49 (70%), FEV1% 61  Past medical history GERD, Seizure 1988, HLD, HTN  Past surgical history, Family history, Social history, Allergies  Vital signs BP 132/74 mmHg  Pulse 90  Ht 5' 3.75" (1.619 m)  Wt 232 lb 3.2 oz (105.325 kg)  BMI 40.18 kg/m2  SpO2 95%  History of Present Illness: Meredith Marshall is a 75 y.o. female former smoker with COPD.  Her breathing has been okay.  She gets occasional cough and wheeze.  She does not use her spiriva on consistent basis >> she doesn't feel like she needs this all the time.  She would like to go to pulmonary rehab.  Physical Exam:  General - No distress ENT - No sinus tenderness, no oral exudate, no LAN Cardiac - s1s2 regular, no murmur Chest - No wheeze/rales/dullness Back - No focal tenderness Abd -  Soft, non-tender Ext - No edema Neuro - Normal strength Skin - No rashes Psych - normal mood, and behavior   Assessment/Plan:  COPD with emphysema. - continue spiriva with prn proair - will arrang for referral to pulmonary rehab   Patient Instructions  Will arrange for referral to pulmonary rehab  Follow up in 6 months    Chesley Mires, MD [facility] Pager:  [phone] 12/26/2015

## 2015-12-26 NOTE — Patient Instructions (Signed)
Will arrange for referral to pulmonary rehab  Follow up in 6 months 

## 2016-01-14 ENCOUNTER — Encounter (HOSPITAL_COMMUNITY): Payer: Self-pay

## 2016-01-14 ENCOUNTER — Encounter (HOSPITAL_COMMUNITY)
Admission: RE | Admit: 2016-01-14 | Discharge: 2016-01-14 | Disposition: A | Discharge status: Home / Self Care | Payer: Medicare Other | Source: Ambulatory Visit | Attending: Pulmonary Disease | Admitting: Pulmonary Disease

## 2016-01-14 VITALS — BP 136/60 | HR 114 | Ht 64.0 in | Wt 232.1 lb

## 2016-01-14 DIAGNOSIS — M810 Age-related osteoporosis without current pathological fracture: Secondary | ICD-10-CM | POA: Insufficient documentation

## 2016-01-14 DIAGNOSIS — Z79899 Other long term (current) drug therapy: Secondary | ICD-10-CM | POA: Insufficient documentation

## 2016-01-14 DIAGNOSIS — I10 Essential (primary) hypertension: Secondary | ICD-10-CM | POA: Diagnosis not present

## 2016-01-14 DIAGNOSIS — K219 Gastro-esophageal reflux disease without esophagitis: Secondary | ICD-10-CM | POA: Insufficient documentation

## 2016-01-14 DIAGNOSIS — J449 Chronic obstructive pulmonary disease, unspecified: Secondary | ICD-10-CM | POA: Diagnosis not present

## 2016-01-14 DIAGNOSIS — E785 Hyperlipidemia, unspecified: Secondary | ICD-10-CM | POA: Insufficient documentation

## 2016-01-14 DIAGNOSIS — J432 Centrilobular emphysema: Secondary | ICD-10-CM | POA: Diagnosis present

## 2016-01-14 DIAGNOSIS — Z87891 Personal history of nicotine dependence: Secondary | ICD-10-CM | POA: Insufficient documentation

## 2016-01-14 NOTE — Progress Notes (Signed)
Meredith Marshall 75 y.o. female Pulmonary Rehab Orientation Note Patient arrived today in Cardiac and Pulmonary Rehab for orientation to Pulmonary Rehab. She and her husband are both here today for orientation to pulmonary rehab and she actually pushed her husband in a wheelchair to pulmonary rehab.  She was SOB when she arrived.  When she and he left the department I pushed her husband in a wheelchair back to [employer] parking and she was able to walk.  She did stop to rest and purse lip breathe, but she tolerated it well.   She does not carry portable oxygen. Per pt, she never uses oxygen.. Color good, skin warm and dry. Patient is oriented to time and place. Patient's medical history, psychosocial health, and medications reviewed. Psychosocial assessment reveals pt lives with their spouse. Pt is currently retired from a funeral home.  Pt hobbies include reading,  crossword puzzles and crochet. Pt reports her stress level is moderate. Areas of stress/anxiety include her health and her husband.  She has been through pulmonary rehab several years ago and realizes that it improved her strength and stamina.  Her husband is very demanding and requires quite a lot of attention and at times he creates stress for her.  Pt does not exhibit signs of depression. PHQ2/9 score 0/0. Pt shows good  coping skills with positive outlook .  Offered emotional support and reassurance. Will continue to monitor and evaluate progress toward psychosocial concerns if any arise.  [name] Kitchen Physical assessment reveals heart rate is tachycardic, breath sounds clear to auscultation, no wheezes, rales, or rhonchi.After she rested about 15 minutes I rechecked her heart rate and it had decreased to 78 at rest.   Grip strength equal, strong. Distal pulses 2+ bilateral posterior pulses present. Patient reports she does take medications as prescribed. Patient states she follows a Regular diet. The patient reports no specific efforts to gain or lose weight..  Patient's weight will be monitored closely. Demonstration and practice of PLB using pulse oximeter. Patient able to return demonstration satisfactorily. Safety and hand hygiene in the exercise area reviewed with patient. Patient voices understanding of the information reviewed. Department expectations discussed with patient and achievable goals were set. The patient shows enthusiasm about attending the program and we look forward to working with this nice lady. The patient is scheduled for a 6 min walk test on Thursday, Jan 17, 2016 @ 3:30pm and to begin exercise on Tuesday, Jan 29, 2016 @ 1:30 pm.   [id number]

## 2016-01-17 ENCOUNTER — Encounter (HOSPITAL_COMMUNITY)
Admission: RE | Admit: 2016-01-17 | Discharge: 2016-01-17 | Disposition: A | Discharge status: Home / Self Care | Payer: Medicare Other | Source: Ambulatory Visit | Attending: Pulmonary Disease | Admitting: Pulmonary Disease

## 2016-01-17 DIAGNOSIS — J432 Centrilobular emphysema: Secondary | ICD-10-CM | POA: Diagnosis not present

## 2016-01-18 NOTE — Progress Notes (Signed)
Pulmonary Individual Treatment Plan  Patient Details  Name: Meredith Marshall MRN: [id number] Date of Birth: 1941/03/13 Referring Provider:        Pulmonary Rehab Walk Test from 01/17/2016 in [place]   Referring Provider  Dr. Halford Chessman      Initial Encounter Date:       Pulmonary Rehab Walk Test from 01/17/2016 in [place]   Date  01/17/16   Referring Provider  Dr. Halford Chessman      Visit Diagnosis: No diagnosis found.  Patient's Home Medications on Admission:   Current outpatient prescriptions:  .  acetaminophen (TYLENOL) 500 MG tablet, Take by mouth as needed.  , Disp: , Rfl:  .  albuterol (PROAIR HFA) 108 (90 BASE) MCG/ACT inhaler, Inhale 2 puffs into the lungs every 6 (six) hours as needed.  , Disp: , Rfl:  .  atorvastatin (LIPITOR) 20 MG tablet, Take 20 mg by mouth at bedtime.  , Disp: , Rfl:  .  b complex vitamins tablet, Take 1 tablet by mouth daily., Disp: , Rfl:  .  Cholecalciferol (VITAMIN D) 1000 UNITS capsule, Take 2,000 Units by mouth. , Disp: , Rfl:  .  phenytoin (DILANTIN) 100 MG ER capsule, Take 100 mg by mouth 3 (three) times daily.  , Disp: , Rfl:  .  telmisartan (MICARDIS) 40 MG tablet, Take 40 mg by mouth daily.  , Disp: , Rfl:  .  tiotropium (SPIRIVA) 18 MCG inhalation capsule, Place 1 capsule (18 mcg total) into inhaler and inhale daily., Disp: 30 capsule, Rfl: 5 .  [DISCONTINUED] DEXILANT 60 MG capsule, Take 60 mg by mouth daily as needed. , Disp: , Rfl:   Past Medical History: Past Medical History  Diagnosis Date  . GERD (gastroesophageal reflux disease)   . Barrett esophagus     Questionable?  . Hemorrhoids   . 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 Tobacco abuse   . Chronic cystic mastitis 1971  . Cervical spine disease   . Thyroiditis 1989  . Osteoporosis   . COPD (chronic obstructive pulmonary disease) ([place])   . Hyperlipidemia   . Hypertension   . 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 Former Smoker -- 1.00 packs/day for 35 years  . Types: Cigarettes  . Quit date: 11/06/2008  Smokeless tobacco  . Never Used    Labs: Recent Review Flowsheet Data    There is no flowsheet data to display.      Capillary Blood Glucose: No results found for: GLUCAP   ADL UCSD:   Pulmonary Function Assessment:     Pulmonary Function Assessment - 01/14/16 1007    Breath   Bilateral Breath Sounds Clear   Shortness of Breath Yes      Exercise Target Goals: Date: 01/17/16  Exercise Program Goal: Individual exercise prescription set with THRR, safety & activity barriers. Participant demonstrates ability to understand and report RPE using BORG scale, to self-measure pulse accurately, and to acknowledge the importance of the exercise prescription.  Exercise Prescription Goal: Starting with aerobic activity 30 plus minutes a day, 3 days per week for initial exercise prescription. Provide home exercise prescription and guidelines that participant acknowledges understanding prior to discharge.  Activity Barriers & Risk Stratification:     Activity Barriers & Cardiac Risk Stratification - 01/14/16 1005    Activity Barriers &  Cardiac Risk Stratification   Activity Barriers Arthritis;Joint Problems;Deconditioning;Muscular Weakness;Shortness of Breath      6 Minute Walk:     6 Minute Walk      01/18/16 0739       6 Minute Walk   Phase Initial     Distance 1400 feet     Walk Time 6 minutes     # of Rest Breaks 0     MPH 2.65     METS 2.98     RPE 12     Perceived Dyspnea  3     VO2 Peak 10.4     Symptoms No     Resting HR 98 bpm     Resting BP 146/70 mmHg     Max Ex. HR 141 bpm     Max Ex. BP 178/68 mmHg     2 Minute Post BP 144/70 mmHg     Interval HR   Baseline HR 98     1 Minute HR 115     2 Minute HR 130     3 Minute HR 137     4 Minute HR 140     5 Minute HR 134     6 Minute HR  141     2 Minute Post HR 115     Interval Heart Rate? Yes     Interval Oxygen   Baseline Oxygen Saturation % 95 %     Baseline Liters of Oxygen 0 L     1 Minute Oxygen Saturation % 89 %     1 Minute Liters of Oxygen 0 L     2 Minute Oxygen Saturation % 91 %     2 Minute Liters of Oxygen 0 L     3 Minute Oxygen Saturation % 92 %     3 Minute Liters of Oxygen 0 L     4 Minute Oxygen Saturation % 92 %     4 Minute Liters of Oxygen 0 L     5 Minute Oxygen Saturation % 92 %     5 Minute Liters of Oxygen 0 L     6 Minute Oxygen Saturation % 92 %     6 Minute Liters of Oxygen 0 L     2 Minute Post Oxygen Saturation % 95 %     2 Minute Post Liters of Oxygen 0 L        Initial Exercise Prescription:     Initial Exercise Prescription - 01/18/16 0700    Date of Initial Exercise RX and Referring Provider   Date 01/17/16   Referring Provider Dr. Halford Chessman   Oxygen   Oxygen --  room air   Recumbant Bike   Level 2   Watts 30   Minutes 15   NuStep   Level 2   Minutes 15   METs 1.5   Track   Laps 8   Minutes 15   Prescription Details   Frequency (times per week) 2   Duration Progress to 45 minutes of aerobic exercise without signs/symptoms of physical distress   Intensity   THRR 40-80% of Max Heartrate 58-116   Ratings of Perceived Exertion 11-13   Perceived Dyspnea 0-4   Progression   Progression Continue progressive overload as per policy without signs/symptoms or physical distress.   Resistance Training   Training Prescription Yes   Weight orange bands   Reps 10-12      Perform Capillary Blood Glucose checks as needed.  Exercise Prescription Changes:   Exercise Comments:   Discharge Exercise Prescription (Final Exercise Prescription Changes):    Nutrition:  Target Goals: Understanding of nutrition guidelines, daily intake of sodium 1500mg , cholesterol 200mg , calories 30% from fat and 7% or less from saturated fats, daily to have 5 or more servings of fruits and  vegetables.  Biometrics:     Pre Biometrics - 01/14/16 1011    Pre Biometrics   Grip Strength 32 kg       Nutrition Therapy Plan and Nutrition Goals:   Nutrition Discharge: Rate Your Plate Scores:   Psychosocial: Target Goals: Acknowledge presence or absence of depression, maximize coping skills, provide positive support system. Participant is able to verbalize types and ability to use techniques and skills needed for reducing stress and depression.  Initial Review & Psychosocial Screening:     Initial Psych Review & Screening - 01/14/16 [place]? Yes   Barriers   Psychosocial barriers to participate in program There are no identifiable barriers or psychosocial needs.   Screening Interventions   Interventions Encouraged to exercise      Quality of Life Scores:   PHQ-9:     Recent Review Flowsheet Data    Depression screen [facility] 2/9 01/14/2016   Decreased Interest 0   Down, Depressed, Hopeless 0   PHQ - 2 Score 0      Psychosocial Evaluation and Intervention:     Psychosocial Evaluation - 01/14/16 1016    Psychosocial Evaluation & Interventions   Interventions Encouraged to exercise with the program and follow exercise prescription   Continued Psychosocial Services Needed No      Psychosocial Re-Evaluation:  Education: Education Goals: Education classes will be provided on a weekly basis, covering required topics. Participant will state understanding/return demonstration of topics presented.  Learning Barriers/Preferences:     Learning Barriers/Preferences - 01/14/16 1006    Learning Barriers/Preferences   Learning Barriers Hearing  Deaf in right ear   Learning Preferences Individual Instruction;Skilled Demonstration      Education Topics: Risk Factor Reduction:  -Group instruction that is supported by a PowerPoint presentation. Instructor discusses the definition of a risk factor, different risk factors for  pulmonary disease, and how the heart and lungs work together.     Nutrition for Pulmonary Patient:  -Group instruction provided by PowerPoint slides, verbal discussion, and written materials to support subject matter. The instructor gives an explanation and review of healthy diet recommendations, which includes a discussion on weight management, recommendations for fruit and vegetable consumption, as well as protein, fluid, caffeine, fiber, sodium, sugar, and alcohol. Tips for eating when patients are short of breath are discussed.   Pursed Lip Breathing:  -Group instruction that is supported by demonstration and informational handouts. Instructor discusses the benefits of pursed lip and diaphragmatic breathing and detailed demonstration on how to preform both.     Oxygen Safety:  -Group instruction provided by PowerPoint, verbal discussion, and written material to support subject matter. There is an overview of "What is Oxygen" and "Why do we need it".  Instructor also reviews how to create a safe environment for oxygen use, the importance of using oxygen as prescribed, and the risks of noncompliance. There is a brief discussion on traveling with oxygen and resources the patient may utilize.   Oxygen Equipment:  -Group instruction provided by [facility] Staff utilizing handouts, written materials, and equipment demonstrations.   Signs and Symptoms:  -Group instruction provided  by written material and verbal discussion to support subject matter. Warning signs and symptoms of infection, stroke, and heart attack are reviewed and when to call the physician/911 reinforced. Tips for preventing the spread of infection discussed.   Advanced Directives:  -Group instruction provided by verbal instruction and written material to support subject matter. Instructor reviews Advanced Directive laws and proper instruction for filling out document.   Pulmonary Video:  -Group video education that reviews  the importance of medication and oxygen compliance, exercise, good nutrition, pulmonary hygiene, and pursed lip and diaphragmatic breathing for the pulmonary patient.   Exercise for the Pulmonary Patient:  -Group instruction that is supported by a PowerPoint presentation. Instructor discusses benefits of exercise, core components of exercise, frequency, duration, and intensity of an exercise routine, importance of utilizing pulse oximetry during exercise, safety while exercising, and options of places to exercise outside of rehab.     Pulmonary Medications:  -Verbally interactive group education provided by instructor with focus on inhaled medications and proper administration.   Anatomy and Physiology of the Respiratory System and Intimacy:  -Group instruction provided by PowerPoint, verbal discussion, and written material to support subject matter. Instructor reviews respiratory cycle and anatomical components of the respiratory system and their functions. Instructor also reviews differences in obstructive and restrictive respiratory diseases with examples of each. Intimacy, Sex, and Sexuality differences are reviewed with a discussion on how relationships can change when diagnosed with pulmonary disease. Common sexual concerns are reviewed.   Knowledge Questionnaire Score:   Core Components/Risk Factors/Patient Goals at Admission:     Personal Goals and Risk Factors at Admission - 01/14/16 1011    Core Components/Risk Factors/Patient Goals on Admission   Increase Strength and Stamina Yes   Intervention Provide advice, education, support and counseling about physical activity/exercise needs.;Develop an individualized exercise prescription for aerobic and resistive training based on initial evaluation findings, risk stratification, comorbidities and participant's personal goals.   Expected Outcomes Achievement of increased cardiorespiratory fitness and enhanced flexibility, muscular  endurance and strength shown through measurements of functional capacity and personal statement of participant.   Improve shortness of breath with ADL's Yes   Intervention Provide education, individualized exercise plan and daily activity instruction to help decrease symptoms of SOB with activities of daily living.   Expected Outcomes Short Term: Achieves a reduction of symptoms when performing activities of daily living.   Develop more efficient breathing techniques such as purse lipped breathing and diaphragmatic breathing; and practicing self-pacing with activity Yes   Intervention Provide education, demonstration and support about specific breathing techniuqes utilized for more efficient breathing. Include techniques such as pursed lipped breathing, diaphragmatic breathing and self-pacing activity.   Expected Outcomes Short Term: Participant will be able to demonstrate and use breathing techniques as needed throughout daily activities.      Core Components/Risk Factors/Patient Goals Review:      Goals and Risk Factor Review      01/14/16 1015           Core Components/Risk Factors/Patient Goals Review   Personal Goals Review Increase Strength and Stamina;Improve shortness of breath with ADL's;Develop more efficient breathing techniques such as purse lipped breathing and diaphragmatic breathing and practicing self-pacing with activity.          Core Components/Risk Factors/Patient Goals at Discharge (Final Review):      Goals and Risk Factor Review - 01/14/16 1015    Core Components/Risk Factors/Patient Goals Review   Personal Goals Review Increase Strength and Stamina;Improve shortness  of breath with ADL's;Develop more efficient breathing techniques such as purse lipped breathing and diaphragmatic breathing and practicing self-pacing with activity.      ITP Comments:   Comments:

## 2016-01-23 ENCOUNTER — Encounter (HOSPITAL_COMMUNITY): Payer: Self-pay | Admitting: *Deleted

## 2016-01-29 ENCOUNTER — Encounter (HOSPITAL_COMMUNITY)
Admission: RE | Admit: 2016-01-29 | Discharge: 2016-01-29 | Disposition: A | Discharge status: Home / Self Care | Payer: Medicare Other | Source: Ambulatory Visit | Attending: Pulmonary Disease | Admitting: Pulmonary Disease

## 2016-01-29 VITALS — Wt 232.4 lb

## 2016-01-29 DIAGNOSIS — J432 Centrilobular emphysema: Secondary | ICD-10-CM

## 2016-01-29 NOTE — Progress Notes (Signed)
Daily Session Note  Patient Details  Name: Meredith Marshall MRN: [id number] Date of Birth: 10-Apr-1941 Referring Provider:        Pulmonary Rehab Walk Test from 01/17/2016 in [place]   Referring Provider  Dr. Halford Chessman      Encounter Date: 01/29/2016  Check In:     Session Check In - 01/29/16 1458    Check-In   Location [facility]   Staff Present Su Hilt, MS, ACSM RCEP, Exercise Physiologist;Portia Rollene Rotunda, RN, BSN   Supervising physician immediately available to respond to emergencies [facility] immediately available   Physician(s) Dr, Marily Memos   Medication changes reported     No   Fall or balance concerns reported    No   Warm-up and Cool-down Performed as group-led instruction   Resistance Training Performed Yes   VAD Patient? No   Pain Assessment   Currently in Pain? No/denies   Multiple Pain Sites No      Capillary Blood Glucose: No results found for this or any previous visit (from the past 24 hour(s)).      Exercise Prescription Changes - 01/29/16 1500    Exercise Review   Progression Yes   Response to Exercise   Blood Pressure (Admit) 140/60 mmHg   Blood Pressure (Exercise) 138/62 mmHg   Blood Pressure (Exit) 120/64 mmHg   Heart Rate (Admit) 104 bpm   Heart Rate (Exercise) 118 bpm   Heart Rate (Exit) 103 bpm   Oxygen Saturation (Admit) 95 %   Oxygen Saturation (Exercise) 92 %   Oxygen Saturation (Exit) 95 %   Rating of Perceived Exertion (Exercise) 13   Perceived Dyspnea (Exercise) 1   Duration Progress to 45 minutes of aerobic exercise without signs/symptoms of physical distress   Intensity THRR unchanged   Progression   Progression Continue to progress workloads to maintain intensity without signs/symptoms of physical distress.   Resistance Training   Training Prescription Yes   Weight orange bands   Reps 10-12   Recumbant Bike   Level 3   Minutes 15   NuStep   Level 2   Minutes 15   METs 1.7   Track   Laps 15   Minutes 15     Goals Met:  Exercise tolerated well No report of cardiac concerns or symptoms Strength training completed today  Goals Unmet:  Not Applicable  Comments: Service time is from 1330 to 1500    Dr. Rush Farmer is Medical Director for Pulmonary Rehab at [facility].

## 2016-01-31 ENCOUNTER — Encounter (HOSPITAL_COMMUNITY)
Admission: RE | Admit: 2016-01-31 | Discharge: 2016-01-31 | Disposition: A | Discharge status: Home / Self Care | Payer: Medicare Other | Source: Ambulatory Visit | Attending: Pulmonary Disease | Admitting: Pulmonary Disease

## 2016-01-31 VITALS — Wt 231.7 lb

## 2016-01-31 DIAGNOSIS — J432 Centrilobular emphysema: Secondary | ICD-10-CM | POA: Diagnosis not present

## 2016-01-31 NOTE — Progress Notes (Signed)
Daily Session Note  Patient Details  Name: Meredith Marshall MRN: [id number] Date of Birth: 09/01/1941 Referring Provider:        Pulmonary Rehab Walk Test from 01/17/2016 in [place]   Referring Provider  Dr. Halford Chessman      Encounter Date: 01/31/2016  Check In:     Session Check In - 01/31/16 1439    Check-In   Location [facility]   Staff Present Rosebud Poles, RN, BSN;Molly diVincenzo, MS, ACSM RCEP, Exercise Physiologist;Melony Tenpas Ysidro Evert, RN;Portia Rollene Rotunda, RN, BSN   Supervising physician immediately available to respond to emergencies [facility] immediately available   Physician(s) Dr. Eliseo Squires   Medication changes reported     No   Fall or balance concerns reported    No   Warm-up and Cool-down Performed as group-led instruction   Resistance Training Performed Yes   VAD Patient? No   Pain Assessment   Currently in Pain? No/denies   Multiple Pain Sites No      Capillary Blood Glucose: No results found for this or any previous visit (from the past 24 hour(s)).      Exercise Prescription Changes - 01/31/16 1600    Response to Exercise   Blood Pressure (Admit) 166/86 mmHg  recheck BP 138/70   Blood Pressure (Exercise) 160/66 mmHg   Blood Pressure (Exit) 124/64 mmHg   Heart Rate (Admit) 93 bpm   Heart Rate (Exercise) 116 bpm   Heart Rate (Exit) 93 bpm   Oxygen Saturation (Admit) 95 %   Oxygen Saturation (Exercise) 93 %   Oxygen Saturation (Exit) 95 %   Rating of Perceived Exertion (Exercise) 11   Perceived Dyspnea (Exercise) 1   Duration Progress to 45 minutes of aerobic exercise without signs/symptoms of physical distress   Intensity THRR unchanged   Progression   Progression Continue to progress workloads to maintain intensity without signs/symptoms of physical distress.   Resistance Training   Training Prescription Yes   Weight orange bands   Reps 10-12   Treadmill   MPH 1.5   Grade 0   Minutes 15   NuStep   Level  2   Minutes 15   METs 2.3     Goals Met:  Exercise tolerated well No report of cardiac concerns or symptoms Strength training completed today  Goals Unmet:  Not Applicable  Comments: Service time is from 1330 to 1515    Dr. Rush Farmer is Medical Director for Pulmonary Rehab at [facility].

## 2016-02-05 ENCOUNTER — Encounter (HOSPITAL_COMMUNITY)
Admission: RE | Admit: 2016-02-05 | Discharge: 2016-02-05 | Disposition: A | Discharge status: Home / Self Care | Payer: Medicare Other | Source: Ambulatory Visit | Attending: Pulmonary Disease | Admitting: Pulmonary Disease

## 2016-02-05 VITALS — Wt 233.5 lb

## 2016-02-05 DIAGNOSIS — J432 Centrilobular emphysema: Secondary | ICD-10-CM

## 2016-02-05 NOTE — Progress Notes (Signed)
Daily Session Note  Patient Details  Name: SVARA TWYMAN MRN: [id number] Date of Birth: 10/09/1940 Referring Provider:        Pulmonary Rehab Walk Test from 01/17/2016 in [place]   Referring Provider  Dr. Halford Chessman      Encounter Date: 02/05/2016  Check In:     Session Check In - 02/05/16 1324    Check-In   Location [facility]   Staff Present Rosebud Poles, RN, Luisa Hart, RN, BSN;Ramon Dredge, RN, MHA;Molly diVincenzo, MS, ACSM RCEP, Exercise Physiologist   Supervising physician immediately available to respond to emergencies [facility] immediately available   Physician(s) Dr. Marily Memos   Medication changes reported     No   Fall or balance concerns reported    No   Warm-up and Cool-down Performed as group-led instruction   Resistance Training Performed Yes   VAD Patient? No   Pain Assessment   Currently in Pain? No/denies   Multiple Pain Sites No      Capillary Blood Glucose: No results found for this or any previous visit (from the past 24 hour(s)).      Exercise Prescription Changes - 02/05/16 1524    Exercise Review   Progression Yes   Response to Exercise   Blood Pressure (Admit) 128/70 mmHg   Blood Pressure (Exercise) 150/70 mmHg   Blood Pressure (Exit) 118/60 mmHg   Heart Rate (Admit) 82 bpm   Heart Rate (Exercise) 116 bpm   Heart Rate (Exit) 89 bpm   Oxygen Saturation (Admit) 95 %   Oxygen Saturation (Exercise) 93 %   Oxygen Saturation (Exit) 95 %   Rating of Perceived Exertion (Exercise) 11   Perceived Dyspnea (Exercise) 1   Duration Progress to 45 minutes of aerobic exercise without signs/symptoms of physical distress   Intensity THRR unchanged   Progression   Progression Continue to progress workloads to maintain intensity without signs/symptoms of physical distress.   Resistance Training   Training Prescription Yes   Weight orange bands   Reps 10-12   Treadmill   MPH 2   Grade 0    Minutes 15   Recumbant Bike   Level 3   Minutes 15   NuStep   Level 2   Minutes 15   METs 2.6     Goals Met:  Using PLB without cueing & demonstrates good technique Exercise tolerated well No report of cardiac concerns or symptoms Strength training completed today  Goals Unmet:  Not Applicable  Comments: Service time is from 1330 to 1500   Dr. Rush Farmer is Medical Director for Pulmonary Rehab at [facility].

## 2016-02-07 ENCOUNTER — Encounter (HOSPITAL_COMMUNITY)
Admission: RE | Admit: 2016-02-07 | Discharge: 2016-02-07 | Disposition: A | Discharge status: Home / Self Care | Payer: Medicare Other | Source: Ambulatory Visit | Attending: Pulmonary Disease | Admitting: Pulmonary Disease

## 2016-02-07 DIAGNOSIS — Z87891 Personal history of nicotine dependence: Secondary | ICD-10-CM | POA: Insufficient documentation

## 2016-02-07 DIAGNOSIS — J449 Chronic obstructive pulmonary disease, unspecified: Secondary | ICD-10-CM | POA: Insufficient documentation

## 2016-02-07 DIAGNOSIS — Z79899 Other long term (current) drug therapy: Secondary | ICD-10-CM | POA: Insufficient documentation

## 2016-02-07 DIAGNOSIS — E785 Hyperlipidemia, unspecified: Secondary | ICD-10-CM | POA: Insufficient documentation

## 2016-02-07 DIAGNOSIS — M810 Age-related osteoporosis without current pathological fracture: Secondary | ICD-10-CM | POA: Diagnosis not present

## 2016-02-07 DIAGNOSIS — I10 Essential (primary) hypertension: Secondary | ICD-10-CM | POA: Diagnosis not present

## 2016-02-07 DIAGNOSIS — K219 Gastro-esophageal reflux disease without esophagitis: Secondary | ICD-10-CM | POA: Diagnosis not present

## 2016-02-07 DIAGNOSIS — J432 Centrilobular emphysema: Secondary | ICD-10-CM | POA: Insufficient documentation

## 2016-02-07 NOTE — Progress Notes (Signed)
Daily Session Note  Patient Details  Name: Meredith Marshall MRN: [id number] Date of Birth: 1941-07-26 Referring Provider:        Pulmonary Rehab Walk Test from 01/17/2016 in [place]   Referring Provider  Dr. Halford Chessman      Encounter Date: 02/07/2016  Check In:     Session Check In - 02/07/16 1402    Check-In   Location [facility]   Staff Present Rosebud Poles, RN, BSN;Lisa Ysidro Evert, RN;Portia Rollene Rotunda, RN, BSN;Ramon Dredge, RN, MHA;Molly diVincenzo, MS, ACSM RCEP, Exercise Physiologist   Supervising physician immediately available to respond to emergencies [facility] immediately available   Physician(s) Dr. Renaee Munda   Medication changes reported     No   Fall or balance concerns reported    No   Warm-up and Cool-down Performed as group-led instruction   Resistance Training Performed Yes   VAD Patient? No   Pain Assessment   Currently in Pain? No/denies   Multiple Pain Sites No      Capillary Blood Glucose: No results found for this or any previous visit (from the past 24 hour(s)).      Exercise Prescription Changes - 02/07/16 1600    Exercise Review   Progression Yes   Response to Exercise   Blood Pressure (Admit) 140/80 mmHg   Blood Pressure (Exercise) 178/84 mmHg   Blood Pressure (Exit) 126/70 mmHg   Heart Rate (Admit) 88 bpm   Heart Rate (Exercise) 114 bpm   Heart Rate (Exit) 93 bpm   Oxygen Saturation (Admit) 94 %   Oxygen Saturation (Exercise) 92 %   Oxygen Saturation (Exit) 96 %   Rating of Perceived Exertion (Exercise) 11   Perceived Dyspnea (Exercise) 1   Duration Progress to 45 minutes of aerobic exercise without signs/symptoms of physical distress   Intensity THRR unchanged   Progression   Progression Continue to progress workloads to maintain intensity without signs/symptoms of physical distress.   Resistance Training   Training Prescription Yes   Weight orange bands   Reps 10-12   Interval Training    Interval Training No   Recumbant Bike   Level 3   Minutes 15   NuStep   Level 3   Minutes 15   METs 2.3     Goals Met:  Exercise tolerated well Strength training completed today  Goals Unmet:  Not Applicable  Comments: Service time is from 1330 to 1530    Dr. Rush Farmer is Medical Director for Pulmonary Rehab at [facility].

## 2016-02-12 ENCOUNTER — Encounter (HOSPITAL_COMMUNITY)
Admission: RE | Admit: 2016-02-12 | Discharge: 2016-02-12 | Disposition: A | Discharge status: Home / Self Care | Payer: Medicare Other | Source: Ambulatory Visit | Attending: Pulmonary Disease | Admitting: Pulmonary Disease

## 2016-02-12 VITALS — Wt 230.4 lb

## 2016-02-12 DIAGNOSIS — J432 Centrilobular emphysema: Secondary | ICD-10-CM | POA: Diagnosis not present

## 2016-02-12 NOTE — Progress Notes (Signed)
Daily Session Note  Patient Details  Name: Meredith Marshall MRN: [id number] Date of Birth: 1940/12/18 Referring Provider:        Pulmonary Rehab Walk Test from 01/17/2016 in [place]   Referring Provider  Dr. Halford Chessman      Encounter Date: 02/12/2016  Check In:     Session Check In - 02/12/16 1321    Check-In   Location [facility]   Staff Present Rosebud Poles, RN, BSN;Taydon Nasworthy Ysidro Evert, RN;Portia Rollene Rotunda, RN, BSN;Ramon Dredge, RN, MHA;Molly diVincenzo, MS, ACSM RCEP, Exercise Physiologist   Supervising physician immediately available to respond to emergencies [facility] immediately available   Physician(s) Dr. Marily Memos   Medication changes reported     No   Fall or balance concerns reported    No   Warm-up and Cool-down Performed as group-led instruction   Resistance Training Performed Yes   Pain Assessment   Currently in Pain? No/denies   Multiple Pain Sites No      Capillary Blood Glucose: No results found for this or any previous visit (from the past 24 hour(s)).      Exercise Prescription Changes - 02/12/16 1500    Exercise Review   Progression Yes   Response to Exercise   Blood Pressure (Admit) 122/60 mmHg   Blood Pressure (Exercise) 178/60 mmHg   Blood Pressure (Exit) 106/60 mmHg   Heart Rate (Admit) 93 bpm   Heart Rate (Exercise) 120 bpm   Heart Rate (Exit) 86 bpm   Oxygen Saturation (Admit) 96 %   Oxygen Saturation (Exercise) 91 %   Oxygen Saturation (Exit) 96 %   Rating of Perceived Exertion (Exercise) 12   Perceived Dyspnea (Exercise) 1   Duration Progress to 45 minutes of aerobic exercise without signs/symptoms of physical distress   Intensity THRR unchanged   Progression   Progression Continue to progress workloads to maintain intensity without signs/symptoms of physical distress.   Resistance Training   Training Prescription Yes   Weight ornage bands   Reps 10-12   Interval Training   Interval  Training No   Treadmill   MPH 2   Grade 0   Minutes 15   Recumbant Bike   Level 4   Minutes 15   NuStep   Level 5   Minutes 15   METs 2.3     Goals Met:  Exercise tolerated well No report of cardiac concerns or symptoms Strength training completed today  Goals Unmet:  Not Applicable  Comments: Service time is from 1330 to 1500    Dr. Rush Farmer is Medical Director for Pulmonary Rehab at [facility].

## 2016-02-14 ENCOUNTER — Telehealth (HOSPITAL_COMMUNITY): Payer: Self-pay | Admitting: *Deleted

## 2016-02-14 ENCOUNTER — Encounter (HOSPITAL_COMMUNITY)
Admission: RE | Admit: 2016-02-14 | Discharge: 2016-02-14 | Disposition: A | Discharge status: Home / Self Care | Payer: Medicare Other | Source: Ambulatory Visit | Attending: Pulmonary Disease | Admitting: Pulmonary Disease

## 2016-02-14 VITALS — Wt 231.3 lb

## 2016-02-14 DIAGNOSIS — J432 Centrilobular emphysema: Secondary | ICD-10-CM | POA: Diagnosis not present

## 2016-02-14 NOTE — Progress Notes (Signed)
Meredith Marshall 75 y.o. female Nutrition Note Spoke with pt. Pt is obese. Pt wants to lose wt and has tried many wt loss diets in the past. Pt is trying to lose wt by decreasing portion sizes and increasing activity. Pt is making healthy food choices the majority of the time.  Pt's Rate Your Plate results reviewed with pt. Pt avoids most salty food; does not use canned/ convenience food frequently.  Pt occasionally adds salt to food.  The role of sodium in lung disease reviewed with pt. Pt expressed understanding of the information reviewed via feedback method.    No results found for: HGBA1C  Nutrition Diagnosis ? Food-and nutrition-related knowledge deficit related to lack of exposure to information as related to diagnosis of pulmonary disease ? Obesity related to excessive energy intake as evidenced by a BMI of 39.9 ?  Nutrition Intervention ? Pt's individual nutrition plan and goals reviewed with pt. ? Benefits of adopting healthy eating habits discussed when pt's Rate Your Plate reviewed. ? Pt to attend the Nutrition and Lung Disease class - met; 02/07/16 ? Continual client-centered nutrition education by RD, as part of interdisciplinary care. Goal(s) 1. Identify food quantities necessary to achieve wt loss of  -2# per week to a goal wt loss of 2.7-10.9 kg (6-24 lb) at graduation from pulmonary rehab. Monitor and Evaluate progress toward nutrition goal with team.   Derek Mound, M.Ed, RD, LDN, CDE 02/14/2016 3:19 PM

## 2016-02-14 NOTE — Progress Notes (Signed)
Daily Session Note  Patient Details  Name: Meredith Marshall MRN: [id number] Date of Birth: July 09, 1941 Referring Provider:        Pulmonary Rehab Walk Test from 01/17/2016 in [place]   Referring Provider  Dr. Halford Chessman      Encounter Date: 02/14/2016  Check In:     Session Check In - 02/14/16 1346    Check-In   [facility]   Staff Present Rosebud Poles, RN, BSN;Molly diVincenzo, MS, ACSM RCEP, Exercise Physiologist;Portia Rollene Rotunda, RN, BSN   Supervising physician immediately available to respond to emergencies [facility] immediately available   Physician(s) Dr Marily Memos   Medication changes reported     No   Fall or balance concerns reported    No   Warm-up and Cool-down Performed as group-led instruction   Resistance Training Performed Yes   Pain Assessment   Currently in Pain? No/denies   Multiple Pain Sites No      Capillary Blood Glucose: No results found for this or any previous visit (from the past 24 hour(s)).      Exercise Prescription Changes - 02/14/16 1600    Response to Exercise   Blood Pressure (Admit) 144/72 mmHg   Blood Pressure (Exercise) 160/68 mmHg   Blood Pressure (Exit) 140/60 mmHg   Heart Rate (Admit) 89 bpm   Heart Rate (Exercise) 118 bpm   Heart Rate (Exit) 99 bpm   Oxygen Saturation (Admit) 96 %   Oxygen Saturation (Exercise) 91 %   Oxygen Saturation (Exit) 95 %   Rating of Perceived Exertion (Exercise) 11   Perceived Dyspnea (Exercise) 2   Duration Progress to 45 minutes of aerobic exercise without signs/symptoms of physical distress   Intensity THRR unchanged   Progression   Progression Continue to progress workloads to maintain intensity without signs/symptoms of physical distress.   Resistance Training   Training Prescription Yes   Weight orange bands   Reps 10-12   Interval Training   Interval Training No   Treadmill   MPH 2   Grade 0   Minutes 15   NuStep   Level 5   Minutes 15   METs 2.7     Goals Met:  Exercise tolerated well No report of cardiac concerns or symptoms Strength training completed today  Goals Unmet:  Not Applicable  Comments: Service time is from 1330 to 1500 BP elevated with exercise will address with PCP.   Dr. Rush Farmer is Medical Director for Pulmonary Rehab at [facility].

## 2016-02-19 ENCOUNTER — Encounter (HOSPITAL_COMMUNITY)
Admission: RE | Admit: 2016-02-19 | Discharge: 2016-02-19 | Disposition: A | Discharge status: Home / Self Care | Payer: Medicare Other | Source: Ambulatory Visit | Attending: Pulmonary Disease | Admitting: Pulmonary Disease

## 2016-02-19 DIAGNOSIS — J432 Centrilobular emphysema: Secondary | ICD-10-CM | POA: Diagnosis not present

## 2016-02-19 NOTE — Progress Notes (Signed)
Daily Session Note  Patient Details  Name: Meredith Marshall MRN: [id number] Date of Birth: February 14, 1941 Referring Provider:        Pulmonary Rehab Walk Test from 01/17/2016 in [place]   Referring Provider  Dr. Halford Chessman      Encounter Date: 02/19/2016  Check In:     Session Check In - 02/19/16 1335    Check-In   Location [facility]   Staff Present Su Hilt, MS, ACSM RCEP, Exercise Physiologist;Lisa Ysidro Evert, Felipe Drone, RN, MHA;Portia Rollene Rotunda, RN, BSN   Supervising physician immediately available to respond to emergencies [facility] immediately available   Physician(s) Dr Marily Memos   Medication changes reported     No   Fall or balance concerns reported    No   Resistance Training Performed Yes   VAD Patient? No   Pain Assessment   Currently in Pain? No/denies   Multiple Pain Sites No      Capillary Blood Glucose: No results found for this or any previous visit (from the past 24 hour(s)).      Exercise Prescription Changes - 02/19/16 1500    Exercise Review   Progression Yes   Response to Exercise   Blood Pressure (Admit) 140/72 mmHg   Blood Pressure (Exercise) 160/60 mmHg  started on lasix    Blood Pressure (Exit) 120/60 mmHg   Heart Rate (Admit) 80 bpm   Heart Rate (Exercise) 118 bpm   Heart Rate (Exit) 90 bpm   Oxygen Saturation (Admit) 96 %   Oxygen Saturation (Exercise) 92 %   Oxygen Saturation (Exit) 94 %   Rating of Perceived Exertion (Exercise) 11   Perceived Dyspnea (Exercise) 1   Duration Progress to 45 minutes of aerobic exercise without signs/symptoms of physical distress   Intensity THRR unchanged   Progression   Progression Continue to progress workloads to maintain intensity without signs/symptoms of physical distress.   Resistance Training   Training Prescription Yes   Weight orange bands   Reps 10-12   Interval Training   Interval Training No   Treadmill   MPH 2   Grade 0   Minutes 15   Recumbant Bike   Level 3.5   Minutes 15   NuStep   Level 5   Minutes 15   METs 2.5     Goals Met:  Improved SOB with ADL's Using PLB without cueing & demonstrates good technique Exercise tolerated well Strength training completed today  Goals Unmet:  Not Applicable  Comments: Service time is from 1330 to 1500    Dr. Rush Farmer is Medical Director for Pulmonary Rehab at [facility].

## 2016-02-19 NOTE — Progress Notes (Signed)
Pulmonary Individual Treatment Plan  Patient Details  Name: Meredith Marshall MRN: [id number] Date of Birth: [birth year] Referring Provider:        Pulmonary Rehab Walk Test from 01/17/2016 in [place]   Referring Provider  Dr. Halford Chessman      Initial Encounter Date:       Pulmonary Rehab Walk Test from 01/17/2016 in [place]   Date  01/17/16   Referring Provider  Dr. Halford Chessman      Visit Diagnosis: No diagnosis found.  Patient's Home Medications on Admission:   Current outpatient prescriptions:  .  acetaminophen (TYLENOL) 500 MG tablet, Take by mouth as needed.  , Disp: , Rfl:  .  albuterol (PROAIR HFA) 108 (90 BASE) MCG/ACT inhaler, Inhale 2 puffs into the lungs every 6 (six) hours as needed.  , Disp: , Rfl:  .  atorvastatin (LIPITOR) 20 MG tablet, Take 20 mg by mouth at bedtime.  , Disp: , Rfl:  .  b complex vitamins tablet, Take 1 tablet by mouth daily., Disp: , Rfl:  .  Cholecalciferol (VITAMIN D) 1000 UNITS capsule, Take 2,000 Units by mouth. , Disp: , Rfl:  .  phenytoin (DILANTIN) 100 MG ER capsule, Take 100 mg by mouth 3 (three) times daily.  , Disp: , Rfl:  .  telmisartan (MICARDIS) 40 MG tablet, Take 40 mg by mouth daily.  , Disp: , Rfl:  .  tiotropium (SPIRIVA) 18 MCG inhalation capsule, Place 1 capsule (18 mcg total) into inhaler and inhale daily., Disp: 30 capsule, Rfl: 5 .  [DISCONTINUED] DEXILANT 60 MG capsule, Take 60 mg by mouth daily as needed. , Disp: , Rfl:   Past Medical History: Past Medical History  Diagnosis Date  . GERD (gastroesophageal reflux disease)   . Barrett esophagus     Questionable?  . Hemorrhoids   . 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 Tobacco abuse   . Chronic cystic mastitis 1971  . Cervical spine disease   . Thyroiditis 1989  . Osteoporosis   . COPD (chronic obstructive pulmonary disease) ([place])   . Hyperlipidemia   . Hypertension   . 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 Former Smoker -- 1.00 packs/day for 35 years  . Types: Cigarettes  . Quit date: 11/06/2008  Smokeless tobacco  . Never Used    Labs:     Recent Review Flowsheet Data    There is no flowsheet data to display.      Capillary Blood Glucose: No results found for: GLUCAP   ADL UCSD:     Pulmonary Assessment Scores      01/23/16 1223       ADL UCSD   ADL Phase Entry     SOB Score total 57     CAT Score   CAT Score 20        Pulmonary Function Assessment:     Pulmonary Function Assessment - 01/14/16 1007    Breath   Bilateral Breath Sounds Clear   Shortness of Breath Yes      Exercise Target Goals:    Exercise Program Goal: Individual exercise prescription set with THRR, safety & activity barriers. Participant demonstrates ability to understand and report RPE using BORG scale, to self-measure pulse accurately, and to acknowledge the importance of the exercise prescription.  Exercise Prescription  Goal: Starting with aerobic activity 30 plus minutes a day, 3 days per week for initial exercise prescription. Provide home exercise prescription and guidelines that participant acknowledges understanding prior to discharge.  Activity Barriers & Risk Stratification:     Activity Barriers & Cardiac Risk Stratification - 01/14/16 1005    Activity Barriers & Cardiac Risk Stratification   Activity Barriers Arthritis;Joint Problems;Deconditioning;Muscular Weakness;Shortness of Breath      6 Minute Walk:     6 Minute Walk      01/18/16 0739       6 Minute Walk   Phase Initial     Distance 1400 feet     Walk Time 6 minutes     # of Rest Breaks 0     MPH 2.65     METS 2.98     RPE 12     Perceived Dyspnea  3     VO2 Peak 10.4     Symptoms No     Resting HR 98 bpm     Resting BP 146/70 mmHg     Max Ex. HR 141 bpm     Max Ex. BP 178/68 mmHg     2 Minute Post BP 144/70 mmHg      Interval HR   Baseline HR 98     1 Minute HR 115     2 Minute HR 130     3 Minute HR 137     4 Minute HR 140     5 Minute HR 134     6 Minute HR 141     2 Minute Post HR 115     Interval Heart Rate? Yes     Interval Oxygen   Baseline Oxygen Saturation % 95 %     Baseline Liters of Oxygen 0 L     1 Minute Oxygen Saturation % 89 %     1 Minute Liters of Oxygen 0 L     2 Minute Oxygen Saturation % 91 %     2 Minute Liters of Oxygen 0 L     3 Minute Oxygen Saturation % 92 %     3 Minute Liters of Oxygen 0 L     4 Minute Oxygen Saturation % 92 %     4 Minute Liters of Oxygen 0 L     5 Minute Oxygen Saturation % 92 %     5 Minute Liters of Oxygen 0 L     6 Minute Oxygen Saturation % 92 %     6 Minute Liters of Oxygen 0 L     2 Minute Post Oxygen Saturation % 95 %     2 Minute Post Liters of Oxygen 0 L        Initial Exercise Prescription:     Initial Exercise Prescription - 01/18/16 0700    Date of Initial Exercise RX and Referring Provider   Date 01/17/16   Referring Provider Dr. Halford Chessman   Oxygen   Oxygen --  room air   Recumbant Bike   Level 2   Watts 30   Minutes 15   NuStep   Level 2   Minutes 15   METs 1.5   Track   Laps 8   Minutes 15   Prescription Details   Frequency (times per week) 2   Duration Progress to 45 minutes of aerobic exercise without signs/symptoms of physical distress   Intensity   THRR 40-80% of Max Heartrate 58-116   Ratings  of Perceived Exertion 11-13   Perceived Dyspnea 0-4   Progression   Progression Continue progressive overload as per policy without signs/symptoms or physical distress.   Resistance Training   Training Prescription Yes   Weight orange bands   Reps 10-12      Perform Capillary Blood Glucose checks as needed.  Exercise Prescription Changes:      Exercise Prescription Changes      01/29/16 1500 01/31/16 1600 02/05/16 1524 02/07/16 1600 02/12/16 1500   Exercise Review   Progression Yes  Yes Yes Yes   Response  to Exercise   Blood Pressure (Admit) 140/60 mmHg 166/86 mmHg  recheck BP 138/70 128/70 mmHg 140/80 mmHg 122/60 mmHg   Blood Pressure (Exercise) 138/62 mmHg 160/66 mmHg 150/70 mmHg 178/84 mmHg 178/60 mmHg   Blood Pressure (Exit) 120/64 mmHg 124/64 mmHg 118/60 mmHg 126/70 mmHg 106/60 mmHg   Heart Rate (Admit) 104 bpm 93 bpm 82 bpm 88 bpm 93 bpm   Heart Rate (Exercise) 118 bpm 116 bpm 116 bpm 114 bpm 120 bpm   Heart Rate (Exit) 103 bpm 93 bpm 89 bpm 93 bpm 86 bpm   Oxygen Saturation (Admit) 95 % 95 % 95 % 94 % 96 %   Oxygen Saturation (Exercise) 92 % 93 % 93 % 92 % 91 %   Oxygen Saturation (Exit) 95 % 95 % 95 % 96 % 96 %   Rating of Perceived Exertion (Exercise) 13 11 11 11 12    Perceived Dyspnea (Exercise) 1 1 1 1 1    Duration Progress to 45 minutes of aerobic exercise without signs/symptoms of physical distress Progress to 45 minutes of aerobic exercise without signs/symptoms of physical distress Progress to 45 minutes of aerobic exercise without signs/symptoms of physical distress Progress to 45 minutes of aerobic exercise without signs/symptoms of physical distress Progress to 45 minutes of aerobic exercise without signs/symptoms of physical distress   Intensity THRR unchanged THRR unchanged THRR unchanged THRR unchanged THRR unchanged   Progression   Progression Continue to progress workloads to maintain intensity without signs/symptoms of physical distress. Continue to progress workloads to maintain intensity without signs/symptoms of physical distress. Continue to progress workloads to maintain intensity without signs/symptoms of physical distress. Continue to progress workloads to maintain intensity without signs/symptoms of physical distress. Continue to progress workloads to maintain intensity without signs/symptoms of physical distress.   Resistance Training   Training Prescription Yes Yes Yes Yes Yes   Weight orange bands orange bands orange bands orange bands ornage bands   Reps 10-12  10-12 10-12 10-12 10-12   Interval Training   Interval Training    No No   Treadmill   MPH  1.5 2  2    Grade  0 0  0   Minutes  15 15  15    Recumbant Bike   Level 3  3 3 4    Minutes 15  15 15 15    NuStep   Level 2 2 2 3 5    Minutes 15 15 15 15 15    METs 1.7 2.3 2.6 2.3 2.3   Track   Laps 15       Minutes 15         02/14/16 1500 02/14/16 1600 02/19/16 1500       Exercise Review   Progression   Yes     Response to Exercise   Blood Pressure (Admit)  144/72 mmHg 140/72 mmHg     Blood Pressure (Exercise)  160/68 mmHg 160/60 mmHg  started  on lasix      Blood Pressure (Exit)  140/60 mmHg 120/60 mmHg     Heart Rate (Admit)  89 bpm 80 bpm     Heart Rate (Exercise)  118 bpm 118 bpm     Heart Rate (Exit)  99 bpm 90 bpm     Oxygen Saturation (Admit)  96 % 96 %     Oxygen Saturation (Exercise)  91 % 92 %     Oxygen Saturation (Exit)  95 % 94 %     Rating of Perceived Exertion (Exercise)  11 11     Perceived Dyspnea (Exercise)  2 1     Comments Reviewed home exercise        Duration  Progress to 45 minutes of aerobic exercise without signs/symptoms of physical distress Progress to 45 minutes of aerobic exercise without signs/symptoms of physical distress     Intensity  THRR unchanged THRR unchanged     Progression   Progression  Continue to progress workloads to maintain intensity without signs/symptoms of physical distress. Continue to progress workloads to maintain intensity without signs/symptoms of physical distress.     Resistance Training   Training Prescription  Yes Yes     Weight  orange bands orange bands     Reps  10-12 10-12     Interval Training   Interval Training  No No     Treadmill   MPH  2 2     Grade  0 0     Minutes  15 15     Recumbant Bike   Level   3.5     Minutes   15     NuStep   Level  5 5     Minutes  15 15     METs  2.7 2.5        Exercise Comments:      Exercise Comments      02/14/16 1521 02/21/16 0846         Exercise Comments Home  exercise reviewed today. Patient was advised to walk/bike 30-45 minutes 2-3 days a week. Patient is cont. to increase exercise intensity at home and at rehab. Monitoring BP closely.          Discharge Exercise Prescription (Final Exercise Prescription Changes):     Exercise Prescription Changes - 02/19/16 1500    Exercise Review   Progression Yes   Response to Exercise   Blood Pressure (Admit) 140/72 mmHg   Blood Pressure (Exercise) 160/60 mmHg  started on lasix    Blood Pressure (Exit) 120/60 mmHg   Heart Rate (Admit) 80 bpm   Heart Rate (Exercise) 118 bpm   Heart Rate (Exit) 90 bpm   Oxygen Saturation (Admit) 96 %   Oxygen Saturation (Exercise) 92 %   Oxygen Saturation (Exit) 94 %   Rating of Perceived Exertion (Exercise) 11   Perceived Dyspnea (Exercise) 1   Duration Progress to 45 minutes of aerobic exercise without signs/symptoms of physical distress   Intensity THRR unchanged   Progression   Progression Continue to progress workloads to maintain intensity without signs/symptoms of physical distress.   Resistance Training   Training Prescription Yes   Weight orange bands   Reps 10-12   Interval Training   Interval Training No   Treadmill   MPH 2   Grade 0   Minutes 15   Recumbant Bike   Level 3.5   Minutes 15   NuStep   Level 5  Minutes 15   METs 2.5       Nutrition:  Target Goals: Understanding of nutrition guidelines, daily intake of sodium 1500mg , cholesterol 200mg , calories 30% from fat and 7% or less from saturated fats, daily to have 5 or more servings of fruits and vegetables.  Biometrics:     Pre Biometrics - 01/14/16 1011    Pre Biometrics   Grip Strength 32 kg       Nutrition Therapy Plan and Nutrition Goals:     Nutrition Therapy & Goals - 02/14/16 1518    Nutrition Therapy   Diet General, healthful   Personal Nutrition Goals   Personal Goal #1 Promote slow wt loss of 0.5-2 lb/wk to a goal wt loss of 6-24 lb at graduation from  [place], educate and counsel regarding individualized specific dietary modifications aiming towards targeted core components such as weight, hypertension, lipid management, diabetes, heart failure and other comorbidities.   Expected Outcomes Short Term Goal: Understand basic principles of dietary content, such as calories, fat, sodium, cholesterol and nutrients.;Long Term Goal: Adherence to prescribed nutrition plan.      Nutrition Discharge: Rate Your Plate Scores:     Nutrition Assessments - 02/14/16 1516    Rate Your Plate Scores   Pre Score 62      Psychosocial: Target Goals: Acknowledge presence or absence of depression, maximize coping skills, provide positive support system. Participant is able to verbalize types and ability to use techniques and skills needed for reducing stress and depression.  Initial Review & Psychosocial Screening:     Initial Psych Review & Screening - 01/14/16 [place]? Yes   Barriers   Psychosocial barriers to participate in program There are no identifiable barriers or psychosocial needs.   Screening Interventions   Interventions Encouraged to exercise      Quality of Life Scores:     Quality of Life - 01/23/16 1222    Quality of Life Scores   Health/Function Pre 25.17 %   Socioeconomic Pre 25.56 %   Psych/Spiritual Pre 24.93 %   Family Pre 27.88 %   GLOBAL Pre 25.76 %      PHQ-9:     Recent Review Flowsheet Data    Depression screen [facility] 2/9 01/14/2016   Decreased Interest 0   Down, Depressed, Hopeless 0   PHQ - 2 Score 0      Psychosocial Evaluation and Intervention:     Psychosocial Evaluation - 01/14/16 1016    Psychosocial Evaluation & Interventions   Interventions Encouraged to exercise with the program and follow exercise prescription   Continued Psychosocial Services Needed No      Psychosocial Re-Evaluation:      Psychosocial Re-Evaluation      02/12/16 1143           Psychosocial Re-Evaluation   Interventions Encouraged to attend Pulmonary Rehabilitation for the exercise       Comments No psychosocial concerns have been identified.       Continued Psychosocial Services Needed No         Education: Education Goals: Education classes will be provided on a weekly basis, covering required topics. Participant will state understanding/return demonstration of topics presented.  Learning Barriers/Preferences:     Learning Barriers/Preferences - 01/14/16 1006    Learning Barriers/Preferences   Learning Barriers Hearing  Deaf in right ear   Learning Preferences Individual Instruction;Skilled Demonstration  Education Topics: Risk Factor Reduction:  -Group instruction that is supported by a PowerPoint presentation. Instructor discusses the definition of a risk factor, different risk factors for pulmonary disease, and how the heart and lungs work together.     Nutrition for Pulmonary Patient:  -Group instruction provided by PowerPoint slides, verbal discussion, and written materials to support subject matter. The instructor gives an explanation and review of healthy diet recommendations, which includes a discussion on weight management, recommendations for fruit and vegetable consumption, as well as protein, fluid, caffeine, fiber, sodium, sugar, and alcohol. Tips for eating when patients are short of breath are discussed.      PULMONARY REHAB OTHER RESPIRATORY from 02/14/2016 in [place]   Date  02/07/16   Educator  RD   Instruction Review Code  2- meets goals/outcomes      Pursed Lip Breathing:  -Group instruction that is supported by demonstration and informational handouts. Instructor discusses the benefits of pursed lip and diaphragmatic breathing and detailed demonstration on how to preform both.        PULMONARY REHAB OTHER RESPIRATORY from 02/07/2016  in [place]   Date  01/31/16   Educator  EP   Instruction Review Code  2- meets goals/outcomes      Oxygen Safety:  -Group instruction provided by PowerPoint, verbal discussion, and written material to support subject matter. There is an overview of "What is Oxygen" and "Why do we need it".  Instructor also reviews how to create a safe environment for oxygen use, the importance of using oxygen as prescribed, and the risks of noncompliance. There is a brief discussion on traveling with oxygen and resources the patient may utilize.   Oxygen Equipment:  -Group instruction provided by [facility] Staff utilizing handouts, written materials, and equipment demonstrations.   Signs and Symptoms:  -Group instruction provided by written material and verbal discussion to support subject matter. Warning signs and symptoms of infection, stroke, and heart attack are reviewed and when to call the physician/911 reinforced. Tips for preventing the spread of infection discussed.   Advanced Directives:  -Group instruction provided by verbal instruction and written material to support subject matter. Instructor reviews Advanced Directive laws and proper instruction for filling out document.   Pulmonary Video:  -Group video education that reviews the importance of medication and oxygen compliance, exercise, good nutrition, pulmonary hygiene, and pursed lip and diaphragmatic breathing for the pulmonary patient.          PULMONARY REHAB OTHER RESPIRATORY from 02/14/2016 in [place]   Date  02/14/16   Instruction Review Code  2- meets goals/outcomes      Exercise for the Pulmonary Patient:  -Group instruction that is supported by a PowerPoint presentation. Instructor discusses benefits of exercise, core components of exercise, frequency, duration, and intensity of an exercise routine, importance of utilizing pulse oximetry during exercise,  safety while exercising, and options of places to exercise outside of rehab.     Pulmonary Medications:  -Verbally interactive group education provided by instructor with focus on inhaled medications and proper administration.   Anatomy and Physiology of the Respiratory System and Intimacy:  -Group instruction provided by PowerPoint, verbal discussion, and written material to support subject matter. Instructor reviews respiratory cycle and anatomical components of the respiratory system and their functions. Instructor also reviews differences in obstructive and restrictive respiratory diseases with examples of each. Intimacy, Sex, and Sexuality differences are reviewed  with a discussion on how relationships can change when diagnosed with pulmonary disease. Common sexual concerns are reviewed.   Knowledge Questionnaire Score:     Knowledge Questionnaire Score - 01/23/16 1219    Knowledge Questionnaire Score   Pre Score 10/13      Core Components/Risk Factors/Patient Goals at Admission:     Personal Goals and Risk Factors at Admission - 01/14/16 1011    Core Components/Risk Factors/Patient Goals on Admission   Increase Strength and Stamina Yes   Intervention Provide advice, education, support and counseling about physical activity/exercise needs.;Develop an individualized exercise prescription for aerobic and resistive training based on initial evaluation findings, risk stratification, comorbidities and participant's personal goals.   Expected Outcomes Achievement of increased cardiorespiratory fitness and enhanced flexibility, muscular endurance and strength shown through measurements of functional capacity and personal statement of participant.   Improve shortness of breath with ADL's Yes   Intervention Provide education, individualized exercise plan and daily activity instruction to help decrease symptoms of SOB with activities of daily living.   Expected Outcomes Short Term: Achieves  a reduction of symptoms when performing activities of daily living.   Develop more efficient breathing techniques such as purse lipped breathing and diaphragmatic breathing; and practicing self-pacing with activity Yes   Intervention Provide education, demonstration and support about specific breathing techniuqes utilized for more efficient breathing. Include techniques such as pursed lipped breathing, diaphragmatic breathing and self-pacing activity.   Expected Outcomes Short Term: Participant will be able to demonstrate and use breathing techniques as needed throughout daily activities.      Core Components/Risk Factors/Patient Goals Review:      Goals and Risk Factor Review      01/14/16 1015 02/12/16 1139         Core Components/Risk Factors/Patient Goals Review   Personal Goals Review Increase Strength and Stamina;Improve shortness of breath with ADL's;Develop more efficient breathing techniques such as purse lipped breathing and diaphragmatic breathing and practicing self-pacing with activity. Increase Strength and Stamina;Improve shortness of breath with ADL's;Develop more efficient breathing techniques such as purse lipped breathing and diaphragmatic breathing and practicing self-pacing with activity.      Review  this is the first 30 day eval, still too early to see a big difference in strength and stamina, is enjoying exercising in program      Expected Outcomes  She should see an improvement in her stamina in the next 30 days.         Core Components/Risk Factors/Patient Goals at Discharge (Final Review):      Goals and Risk Factor Review - 02/12/16 1139    Core Components/Risk Factors/Patient Goals Review   Personal Goals Review Increase Strength and Stamina;Improve shortness of breath with ADL's;Develop more efficient breathing techniques such as purse lipped breathing and diaphragmatic breathing and practicing self-pacing with activity.   Review this is the first 30 day  eval, still too early to see a big difference in strength and stamina, is enjoying exercising in program   Expected Outcomes She should see an improvement in her stamina in the next 30 days.      ITP Comments:   Comments: ITP REVIEW Pt is making expected progress toward personal goals after completing 7 sessions.   Recommend continued exercise, life style modification, education, and utilization of breathing techniques to increase stamina and strength and decrease shortness of breath with exertion.

## 2016-02-21 ENCOUNTER — Encounter (HOSPITAL_COMMUNITY)
Admission: RE | Admit: 2016-02-21 | Discharge: 2016-02-21 | Disposition: A | Discharge status: Home / Self Care | Payer: Medicare Other | Source: Ambulatory Visit | Attending: Pulmonary Disease | Admitting: Pulmonary Disease

## 2016-02-21 VITALS — Wt 228.4 lb

## 2016-02-21 DIAGNOSIS — J432 Centrilobular emphysema: Secondary | ICD-10-CM | POA: Diagnosis not present

## 2016-02-21 NOTE — Progress Notes (Signed)
Daily Session Note  Patient Details  Name: MONTSERRAT SHEK MRN: [id number] Date of Birth: 12/27/1940 Referring Provider:        Pulmonary Rehab Walk Test from 01/17/2016 in [place]   Referring Provider  Dr. Halford Chessman      Encounter Date: 02/21/2016  Check In:     Session Check In - 02/21/16 1350    Check-In   Location [facility]   Staff Present Su Hilt, MS, ACSM RCEP, Exercise Physiologist;Peityn Payton Leonia Reeves, RN, Luisa Hart, RN, BSN   Supervising physician immediately available to respond to emergencies [facility] immediately available   Physician(s) Dr. Waldron Labs   Medication changes reported     No   Fall or balance concerns reported    No   Warm-up and Cool-down Performed as group-led Location manager Performed Yes   VAD Patient? No   Pain Assessment   Currently in Pain? No/denies   Multiple Pain Sites No      Capillary Blood Glucose: No results found for this or any previous visit (from the past 24 hour(s)).      Exercise Prescription Changes - 02/21/16 1600    Response to Exercise   Blood Pressure (Admit) 124/60 mmHg   Blood Pressure (Exercise) 154/70 mmHg   Blood Pressure (Exit) 122/60 mmHg   Heart Rate (Admit) 91 bpm   Heart Rate (Exercise) 126 bpm   Heart Rate (Exit) 75 bpm   Oxygen Saturation (Admit) 94 %   Oxygen Saturation (Exercise) 90 %   Oxygen Saturation (Exit) 95 %   Rating of Perceived Exertion (Exercise) 11   Perceived Dyspnea (Exercise) 1   Duration Progress to 45 minutes of aerobic exercise without signs/symptoms of physical distress   Intensity THRR unchanged   Progression   Progression Continue to progress workloads to maintain intensity without signs/symptoms of physical distress.   Resistance Training   Training Prescription Yes   Weight orange bands   Reps 10-12   Interval Training   Interval Training No   Treadmill   MPH 2   Minutes 15   Recumbant Bike    Level 3.5   Minutes 15     Goals Met:  Improved SOB with ADL's Using PLB without cueing & demonstrates good technique Exercise tolerated well Strength training completed today  Goals Unmet:  Not Applicable  Comments: Service time is from 1330 to 1550    Dr. Rush Farmer is Medical Director for Pulmonary Rehab at [facility].

## 2016-02-22 ENCOUNTER — Ambulatory Visit
Admission: RE | Admit: 2016-02-22 | Discharge: 2016-02-22 | Disposition: A | Discharge status: Home / Self Care | Payer: Medicare Other | Source: Ambulatory Visit | Attending: Internal Medicine | Admitting: Internal Medicine

## 2016-02-22 ENCOUNTER — Other Ambulatory Visit: Payer: Self-pay | Admitting: Internal Medicine

## 2016-02-22 DIAGNOSIS — M549 Dorsalgia, unspecified: Secondary | ICD-10-CM

## 2016-02-22 DIAGNOSIS — M542 Cervicalgia: Secondary | ICD-10-CM

## 2016-02-26 ENCOUNTER — Encounter (HOSPITAL_COMMUNITY)
Admission: RE | Admit: 2016-02-26 | Discharge: 2016-02-26 | Disposition: A | Discharge status: Home / Self Care | Payer: Medicare Other | Source: Ambulatory Visit | Attending: Pulmonary Disease | Admitting: Pulmonary Disease

## 2016-02-26 VITALS — Wt 230.6 lb

## 2016-02-26 DIAGNOSIS — J432 Centrilobular emphysema: Secondary | ICD-10-CM | POA: Diagnosis not present

## 2016-02-26 NOTE — Progress Notes (Signed)
Daily Session Note  Patient Details  Name: Meredith Marshall MRN: [id number] Date of Birth: [birth year] Referring Provider:        Pulmonary Rehab Walk Test from 01/17/2016 in [place]   Referring Provider  Dr. Halford Chessman      Encounter Date: 02/26/2016  Check In:     Session Check In - 02/26/16 1351    Check-In   Location [facility]   Staff Present Su Hilt, MS, ACSM RCEP, Exercise Physiologist;Joan Leonia Reeves, RN, Roque Cash, RN   Supervising physician immediately available to respond to emergencies [facility] immediately available   Physician(s) Dr. Marily Memos   Medication changes reported     No   Fall or balance concerns reported    No   Warm-up and Cool-down Performed as group-led instruction   Resistance Training Performed Yes   VAD Patient? No   Pain Assessment   Currently in Pain? No/denies   Multiple Pain Sites No      Capillary Blood Glucose: No results found for this or any previous visit (from the past 24 hour(s)).      Exercise Prescription Changes - 02/26/16 1600    Exercise Review   Progression Yes   Response to Exercise   Blood Pressure (Admit) 112/60 mmHg   Blood Pressure (Exercise) 130/70 mmHg   Blood Pressure (Exit) 110/56 mmHg   Heart Rate (Admit) 97 bpm   Heart Rate (Exercise) 130 bpm   Heart Rate (Exit) 93 bpm   Oxygen Saturation (Admit) 95 %   Oxygen Saturation (Exercise) 91 %   Oxygen Saturation (Exit) 94 %   Rating of Perceived Exertion (Exercise) 11   Perceived Dyspnea (Exercise) 1   Duration Progress to 45 minutes of aerobic exercise without signs/symptoms of physical distress   Intensity THRR unchanged   Progression   Progression Continue to progress workloads to maintain intensity without signs/symptoms of physical distress.   Resistance Training   Training Prescription Yes   Weight orange bands   Reps 10-12   Interval Training   Interval Training No   Treadmill   MPH 2   Minutes 15   Recumbant Bike   Level 4   Minutes 15   NuStep   Level 6   Minutes 15   METs 2.5     Goals Met:  Exercise tolerated well No report of cardiac concerns or symptoms Strength training completed today  Goals Unmet:  Not Applicable  Comments:Service time is from 1330 to 1505    Dr. Rush Farmer is Medical Director for Pulmonary Rehab at [facility]

## 2016-02-28 ENCOUNTER — Encounter (HOSPITAL_COMMUNITY)
Admission: RE | Admit: 2016-02-28 | Discharge: 2016-02-28 | Disposition: A | Discharge status: Home / Self Care | Payer: Medicare Other | Source: Ambulatory Visit | Attending: Pulmonary Disease | Admitting: Pulmonary Disease

## 2016-02-28 VITALS — Wt 232.1 lb

## 2016-02-28 DIAGNOSIS — J432 Centrilobular emphysema: Secondary | ICD-10-CM

## 2016-02-28 NOTE — Progress Notes (Signed)
Daily Session Note  Patient Details  Name: Meredith Marshall MRN: [id number] Date of Birth: [birth year] Referring Provider:        Pulmonary Rehab Walk Test from 01/17/2016 in [place]   Referring Provider  Dr. Halford Chessman      Encounter Date: 02/28/2016  Check In:     Session Check In - 02/28/16 1629    Check-In   Location [facility]   Staff Present Rosebud Poles, RN, BSN;Lisa Ysidro Evert, RN;Molly diVincenzo, MS, ACSM RCEP, Exercise Physiologist   Supervising physician immediately available to respond to emergencies [facility] immediately available   Physician(s) Dr. Marily Memos   Fall or balance concerns reported    No   Warm-up and Cool-down Performed as group-led instruction   Resistance Training Performed Yes   VAD Patient? No   Pain Assessment   Currently in Pain? No/denies   Multiple Pain Sites No      Capillary Blood Glucose: No results found for this or any previous visit (from the past 24 hour(s)).      Exercise Prescription Changes - 02/28/16 1600    Response to Exercise   Blood Pressure (Admit) 128/68 mmHg   Blood Pressure (Exercise) 100/70 mmHg   Blood Pressure (Exit) 104/60 mmHg   Heart Rate (Admit) 96 bpm   Heart Rate (Exercise) 93 bpm   Heart Rate (Exit) 90 bpm   Oxygen Saturation (Admit) 96 %   Oxygen Saturation (Exercise) 90 %   Oxygen Saturation (Exit) 95 %   Rating of Perceived Exertion (Exercise) 11   Perceived Dyspnea (Exercise) 2   Duration Progress to 45 minutes of aerobic exercise without signs/symptoms of physical distress   Intensity THRR unchanged   Progression   Progression Continue to progress workloads to maintain intensity without signs/symptoms of physical distress.   Resistance Training   Training Prescription Yes   Weight orange bands   Reps 10-12   Interval Training   Interval Training No   Treadmill   MPH 2   Grade 0   Minutes 15   Recumbant Bike   Level 4   Minutes 15   NuStep    Level 5   Minutes 15   METs 2.6     Goals Met:  Independence with exercise equipment Improved SOB with ADL's Exercise tolerated well Strength training completed today  Goals Unmet:  Not Applicable  Comments: Service time is from 1330 to 1500    Dr. Rush Farmer is Medical Director for Pulmonary Rehab at [facility].

## 2016-03-04 ENCOUNTER — Encounter (HOSPITAL_COMMUNITY)
Admission: RE | Admit: 2016-03-04 | Discharge: 2016-03-04 | Disposition: A | Discharge status: Home / Self Care | Payer: Medicare Other | Source: Ambulatory Visit | Attending: Pulmonary Disease | Admitting: Pulmonary Disease

## 2016-03-04 VITALS — Wt 228.8 lb

## 2016-03-04 DIAGNOSIS — J432 Centrilobular emphysema: Secondary | ICD-10-CM

## 2016-03-04 NOTE — Progress Notes (Signed)
Daily Session Note  Patient Details  Name: Meredith Marshall MRN: [id number] Date of Birth: 21-May-1941 Referring Provider:        Pulmonary Rehab Walk Test from 01/17/2016 in [place]   Referring Provider  Dr. Halford Chessman      Encounter Date: 03/04/2016  Check In:     Session Check In - 03/04/16 1349    Check-In   Location [facility]   Staff Present Rosebud Poles, RN, BSN;Donzella Carrol Ysidro Evert, RN;Portia Rollene Rotunda, RN, BSN;Molly diVincenzo, MS, ACSM RCEP, Exercise Physiologist   Supervising physician immediately available to respond to emergencies [facility] immediately available   Physician(s) Dr. Marily Memos   Medication changes reported     No   Fall or balance concerns reported    No   Warm-up and Cool-down Performed as group-led instruction   Resistance Training Performed Yes   VAD Patient? No   Pain Assessment   Currently in Pain? No/denies   Multiple Pain Sites No      Capillary Blood Glucose: No results found for this or any previous visit (from the past 24 hour(s)).      Exercise Prescription Changes - 03/04/16 1500    Response to Exercise   Blood Pressure (Admit) 106/60 mmHg   Blood Pressure (Exercise) 168/60 mmHg   Blood Pressure (Exit) 112/60 mmHg   Heart Rate (Admit) 82 bpm   Heart Rate (Exercise) 130 bpm   Heart Rate (Exit) 94 bpm   Oxygen Saturation (Admit) 96 %   Oxygen Saturation (Exercise) 91 %   Oxygen Saturation (Exit) 95 %   Rating of Perceived Exertion (Exercise) 11   Perceived Dyspnea (Exercise) 1   Duration Progress to 45 minutes of aerobic exercise without signs/symptoms of physical distress   Intensity THRR unchanged   Progression   Progression Continue to progress workloads to maintain intensity without signs/symptoms of physical distress.   Resistance Training   Training Prescription Yes   Weight orange bands   Reps 10-12   Interval Training   Interval Training No   Treadmill   MPH 2   Grade 0   Minutes 17   Recumbant Bike   Level 3.3   Minutes 17   NuStep   Level 6   Minutes 17   METs 2.8     Goals Met:  Exercise tolerated well No report of cardiac concerns or symptoms Strength training completed today  Goals Unmet:  Not Applicable  Comments: Service time is from 1330 to 1505    Dr. Rush Farmer is Medical Director for Pulmonary Rehab at [facility].

## 2016-03-06 ENCOUNTER — Encounter (HOSPITAL_COMMUNITY): Payer: Medicare Other

## 2016-03-11 ENCOUNTER — Encounter (HOSPITAL_COMMUNITY): Payer: Medicare Other

## 2016-03-13 ENCOUNTER — Encounter (HOSPITAL_COMMUNITY)
Admission: RE | Admit: 2016-03-13 | Discharge: 2016-03-13 | Disposition: A | Discharge status: Home / Self Care | Payer: Medicare Other | Source: Ambulatory Visit | Attending: Pulmonary Disease | Admitting: Pulmonary Disease

## 2016-03-13 VITALS — Wt 229.1 lb

## 2016-03-13 DIAGNOSIS — J449 Chronic obstructive pulmonary disease, unspecified: Secondary | ICD-10-CM | POA: Diagnosis not present

## 2016-03-13 DIAGNOSIS — E785 Hyperlipidemia, unspecified: Secondary | ICD-10-CM | POA: Insufficient documentation

## 2016-03-13 DIAGNOSIS — M810 Age-related osteoporosis without current pathological fracture: Secondary | ICD-10-CM | POA: Insufficient documentation

## 2016-03-13 DIAGNOSIS — Z79899 Other long term (current) drug therapy: Secondary | ICD-10-CM | POA: Diagnosis not present

## 2016-03-13 DIAGNOSIS — J432 Centrilobular emphysema: Secondary | ICD-10-CM

## 2016-03-13 DIAGNOSIS — K219 Gastro-esophageal reflux disease without esophagitis: Secondary | ICD-10-CM | POA: Insufficient documentation

## 2016-03-13 DIAGNOSIS — Z87891 Personal history of nicotine dependence: Secondary | ICD-10-CM | POA: Insufficient documentation

## 2016-03-13 DIAGNOSIS — I10 Essential (primary) hypertension: Secondary | ICD-10-CM | POA: Diagnosis not present

## 2016-03-13 NOTE — Progress Notes (Signed)
Daily Session Note  Patient Details  Name: Meredith Marshall MRN: [id number] Date of Birth: [birth year] Referring Provider:        Pulmonary Rehab Walk Test from 01/17/2016 in [place]   Referring Provider  Dr. Halford Chessman      Encounter Date: 03/13/2016  Check In:     Session Check In - 03/13/16 1353    Check-In   Location [facility]   Staff Present Rosebud Poles, RN, BSN;Emary Zalar Ysidro Evert, Felipe Drone, RN, MHA;Molly diVincenzo, MS, ACSM RCEP, Exercise Physiologist   Supervising physician immediately available to respond to emergencies [facility] immediately available   Physician(s) Dr. Cruzita Lederer   Medication changes reported     No   Fall or balance concerns reported    No   Warm-up and Cool-down Performed as group-led instruction   Resistance Training Performed Yes   VAD Patient? No   Pain Assessment   Currently in Pain? No/denies   Multiple Pain Sites No      Capillary Blood Glucose: No results found for this or any previous visit (from the past 24 hour(s)).      Exercise Prescription Changes - 03/13/16 1600    Response to Exercise   Blood Pressure (Admit) 130/58 mmHg   Blood Pressure (Exercise) 166/60 mmHg   Blood Pressure (Exit) 136/60 mmHg   Heart Rate (Admit) 79 bpm   Heart Rate (Exercise) 114 bpm   Heart Rate (Exit) 95 bpm   Oxygen Saturation (Admit) 93 %   Oxygen Saturation (Exercise) 93 %   Oxygen Saturation (Exit) 95 %   Rating of Perceived Exertion (Exercise) 11   Perceived Dyspnea (Exercise) 1   Duration Progress to 45 minutes of aerobic exercise without signs/symptoms of physical distress   Intensity THRR unchanged   Progression   Progression Continue to progress workloads to maintain intensity without signs/symptoms of physical distress.   Resistance Training   Training Prescription Yes   Weight orange abnds   Reps 10-12   Interval Training   Interval Training No   Treadmill   MPH 2   Grade 0    Minutes 17   NuStep   Level 6   Minutes 17   METs 2.5     Goals Met:  Exercise tolerated well No report of cardiac concerns or symptoms Strength training completed today  Goals Unmet:  Not Applicable  Comments: Service time is from 1330 to 1530    Dr. Rush Farmer is Medical Director for Pulmonary Rehab at [facility].

## 2016-03-18 ENCOUNTER — Encounter (HOSPITAL_COMMUNITY)
Admission: RE | Admit: 2016-03-18 | Discharge: 2016-03-18 | Disposition: A | Discharge status: Home / Self Care | Payer: Medicare Other | Source: Ambulatory Visit | Attending: Pulmonary Disease | Admitting: Pulmonary Disease

## 2016-03-18 VITALS — Wt 231.3 lb

## 2016-03-18 DIAGNOSIS — J432 Centrilobular emphysema: Secondary | ICD-10-CM

## 2016-03-18 NOTE — Progress Notes (Signed)
Daily Session Note  Patient Details  Name: Lavilla O Aro MRN: [id number] Date of Birth: 10/18/1940 Referring Provider:        Pulmonary Rehab Walk Test from 01/17/2016 in [facility]   Referring Provider  Dr. Sood      Encounter Date: 03/18/2016  Check In:     Session Check In - 03/18/16 1336    Check-In   Location [facility]   Staff Present Joan Behrens, RN, BSN;Lisa Hughes, RN;Portia Payne, RN, BSN;Annedrea Stackhouse, RN, MHA   Supervising physician immediately available to respond to emergencies [facility] immediately available   Physician(s) Dr. Merrell   Medication changes reported     No   Fall or balance concerns reported    No   Warm-up and Cool-down Performed as group-led instruction   Resistance Training Performed Yes   VAD Patient? No   Pain Assessment   Currently in Pain? No/denies      Capillary Blood Glucose: No results found for this or any previous visit (from the past 24 hour(s)).      Exercise Prescription Changes - 03/18/16 1600    Response to Exercise   Blood Pressure (Admit) 104/62 mmHg   Blood Pressure (Exercise) 108/56 mmHg   Blood Pressure (Exit) 140/56 mmHg   Heart Rate (Admit) 85 bpm   Heart Rate (Exercise) 121 bpm   Heart Rate (Exit) 97 bpm   Oxygen Saturation (Admit) 95 %   Oxygen Saturation (Exercise) 93 %   Oxygen Saturation (Exit) 96 %   Rating of Perceived Exertion (Exercise) 12   Perceived Dyspnea (Exercise) 1   Duration Progress to 45 minutes of aerobic exercise without signs/symptoms of physical distress   Intensity THRR unchanged   Progression   Progression Continue to progress workloads to maintain intensity without signs/symptoms of physical distress.   Resistance Training   Training Prescription Yes   Weight orange abnds   Reps 10-12   Interval Training   Interval Training No   Treadmill   MPH 2   Grade 0   Minutes 17   Recumbant Bike   Level 3.8   Watts 0   Minutes 17   NuStep   Level 6   Minutes 17   METs 2.9     Goals Met:  Independence with exercise equipment Improved SOB with ADL's Using PLB without cueing & demonstrates good technique Exercise tolerated well Strength training completed today  Goals Unmet:  Not Applicable  Comments: Service time is from 1330 to 1515   Dr. Wesam G. Yacoub is Medical Director for Pulmonary Rehab at [facility]. 

## 2016-03-20 ENCOUNTER — Encounter (HOSPITAL_COMMUNITY)
Admission: RE | Admit: 2016-03-20 | Discharge: 2016-03-20 | Disposition: A | Discharge status: Home / Self Care | Payer: Medicare Other | Source: Ambulatory Visit | Attending: Pulmonary Disease | Admitting: Pulmonary Disease

## 2016-03-20 NOTE — Progress Notes (Signed)
Pulmonary Individual Treatment Plan  Patient Details  Name: Meredith Marshall MRN: [id number] Date of Birth: [birth year] Referring Provider:        Pulmonary Rehab Walk Test from 01/17/2016 in [place]   Referring Provider  Dr. Halford Chessman      Initial Encounter Date:       Pulmonary Rehab Walk Test from 01/17/2016 in [place]   Date  01/17/16   Referring Provider  Dr. Halford Chessman      Visit Diagnosis: No diagnosis found.  Patient's Home Medications on Admission:   Current outpatient prescriptions:  .  acetaminophen (TYLENOL) 500 MG tablet, Take by mouth as needed.  , Disp: , Rfl:  .  albuterol (PROAIR HFA) 108 (90 BASE) MCG/ACT inhaler, Inhale 2 puffs into the lungs every 6 (six) hours as needed.  , Disp: , Rfl:  .  atorvastatin (LIPITOR) 20 MG tablet, Take 20 mg by mouth at bedtime.  , Disp: , Rfl:  .  b complex vitamins tablet, Take 1 tablet by mouth daily., Disp: , Rfl:  .  Cholecalciferol (VITAMIN D) 1000 UNITS capsule, Take 2,000 Units by mouth. , Disp: , Rfl:  .  phenytoin (DILANTIN) 100 MG ER capsule, Take 100 mg by mouth 3 (three) times daily.  , Disp: , Rfl:  .  telmisartan (MICARDIS) 40 MG tablet, Take 40 mg by mouth daily.  , Disp: , Rfl:  .  tiotropium (SPIRIVA) 18 MCG inhalation capsule, Place 1 capsule (18 mcg total) into inhaler and inhale daily., Disp: 30 capsule, Rfl: 5 .  [DISCONTINUED] DEXILANT 60 MG capsule, Take 60 mg by mouth daily as needed. , Disp: , Rfl:   Past Medical History: Past Medical History  Diagnosis Date  . GERD (gastroesophageal reflux disease)   . Barrett esophagus     Questionable?  . Hemorrhoids   . 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 Tobacco abuse   . Chronic cystic mastitis 1971  . Cervical spine disease   . Thyroiditis 1989  . Osteoporosis   . COPD (chronic obstructive pulmonary disease) ([place])   . Hyperlipidemia   . Hypertension   . 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 Former Smoker -- 1.00 packs/day for 35 years  . Types: Cigarettes  . Quit date: 11/06/2008  Smokeless tobacco  . Never Used    Labs: Recent Review Flowsheet Data    There is no flowsheet data to display.      Capillary Blood Glucose: No results found for: GLUCAP   ADL UCSD:     Pulmonary Assessment Scores      01/23/16 1223       ADL UCSD   ADL Phase Entry     SOB Score total 57     CAT Score   CAT Score 20        Pulmonary Function Assessment:     Pulmonary Function Assessment - 01/14/16 1007    Breath   Bilateral Breath Sounds Clear   Shortness of Breath Yes      Exercise Target Goals:    Exercise Program Goal: Individual exercise prescription set with THRR, safety & activity barriers. Participant demonstrates ability to understand and report RPE using BORG scale, to self-measure pulse accurately, and to acknowledge the importance of the exercise prescription.  Exercise Prescription Goal: Starting with aerobic  activity 30 plus minutes a day, 3 days per week for initial exercise prescription. Provide home exercise prescription and guidelines that participant acknowledges understanding prior to discharge.  Activity Barriers & Risk Stratification:     Activity Barriers & Cardiac Risk Stratification - 01/14/16 1005    Activity Barriers & Cardiac Risk Stratification   Activity Barriers Arthritis;Joint Problems;Deconditioning;Muscular Weakness;Shortness of Breath      6 Minute Walk:     6 Minute Walk      01/18/16 0739       6 Minute Walk   Phase Initial     Distance 1400 feet     Walk Time 6 minutes     # of Rest Breaks 0     MPH 2.65     METS 2.98     RPE 12     Perceived Dyspnea  3     VO2 Peak 10.4     Symptoms No     Resting HR 98 bpm     Resting BP 146/70 mmHg     Max Ex. HR 141 bpm     Max Ex. BP 178/68 mmHg     2 Minute Post BP 144/70 mmHg     Interval HR    Baseline HR 98     1 Minute HR 115     2 Minute HR 130     3 Minute HR 137     4 Minute HR 140     5 Minute HR 134     6 Minute HR 141     2 Minute Post HR 115     Interval Heart Rate? Yes     Interval Oxygen   Baseline Oxygen Saturation % 95 %     Baseline Liters of Oxygen 0 L     1 Minute Oxygen Saturation % 89 %     1 Minute Liters of Oxygen 0 L     2 Minute Oxygen Saturation % 91 %     2 Minute Liters of Oxygen 0 L     3 Minute Oxygen Saturation % 92 %     3 Minute Liters of Oxygen 0 L     4 Minute Oxygen Saturation % 92 %     4 Minute Liters of Oxygen 0 L     5 Minute Oxygen Saturation % 92 %     5 Minute Liters of Oxygen 0 L     6 Minute Oxygen Saturation % 92 %     6 Minute Liters of Oxygen 0 L     2 Minute Post Oxygen Saturation % 95 %     2 Minute Post Liters of Oxygen 0 L        Initial Exercise Prescription:     Initial Exercise Prescription - 01/18/16 0700    Date of Initial Exercise RX and Referring Provider   Date 01/17/16   Referring Provider Dr. Halford Chessman   Oxygen   Oxygen --  room air   Recumbant Bike   Level 2   Watts 30   Minutes 15   NuStep   Level 2   Minutes 15   METs 1.5   Track   Laps 8   Minutes 15   Prescription Details   Frequency (times per week) 2   Duration Progress to 45 minutes of aerobic exercise without signs/symptoms of physical distress   Intensity   THRR 40-80% of Max Heartrate 58-116   Ratings of Perceived Exertion 11-13  Perceived Dyspnea 0-4   Progression   Progression Continue progressive overload as per policy without signs/symptoms or physical distress.   Resistance Training   Training Prescription Yes   Weight orange bands   Reps 10-12      Perform Capillary Blood Glucose checks as needed.  Exercise Prescription Changes:     Exercise Prescription Changes      01/29/16 1500 01/31/16 1600 02/05/16 1524 02/07/16 1600 02/12/16 1500   Exercise Review   Progression Yes  Yes Yes Yes   Response to Exercise    Blood Pressure (Admit) 140/60 mmHg 166/86 mmHg  recheck BP 138/70 128/70 mmHg 140/80 mmHg 122/60 mmHg   Blood Pressure (Exercise) 138/62 mmHg 160/66 mmHg 150/70 mmHg 178/84 mmHg 178/60 mmHg   Blood Pressure (Exit) 120/64 mmHg 124/64 mmHg 118/60 mmHg 126/70 mmHg 106/60 mmHg   Heart Rate (Admit) 104 bpm 93 bpm 82 bpm 88 bpm 93 bpm   Heart Rate (Exercise) 118 bpm 116 bpm 116 bpm 114 bpm 120 bpm   Heart Rate (Exit) 103 bpm 93 bpm 89 bpm 93 bpm 86 bpm   Oxygen Saturation (Admit) 95 % 95 % 95 % 94 % 96 %   Oxygen Saturation (Exercise) 92 % 93 % 93 % 92 % 91 %   Oxygen Saturation (Exit) 95 % 95 % 95 % 96 % 96 %   Rating of Perceived Exertion (Exercise) 13 11 11 11 12    Perceived Dyspnea (Exercise) 1 1 1 1 1    Duration Progress to 45 minutes of aerobic exercise without signs/symptoms of physical distress Progress to 45 minutes of aerobic exercise without signs/symptoms of physical distress Progress to 45 minutes of aerobic exercise without signs/symptoms of physical distress Progress to 45 minutes of aerobic exercise without signs/symptoms of physical distress Progress to 45 minutes of aerobic exercise without signs/symptoms of physical distress   Intensity THRR unchanged THRR unchanged THRR unchanged THRR unchanged THRR unchanged   Progression   Progression Continue to progress workloads to maintain intensity without signs/symptoms of physical distress. Continue to progress workloads to maintain intensity without signs/symptoms of physical distress. Continue to progress workloads to maintain intensity without signs/symptoms of physical distress. Continue to progress workloads to maintain intensity without signs/symptoms of physical distress. Continue to progress workloads to maintain intensity without signs/symptoms of physical distress.   Resistance Training   Training Prescription Yes Yes Yes Yes Yes   Weight orange bands orange bands orange bands orange bands ornage bands   Reps 10-12 10-12 10-12  10-12 10-12   Interval Training   Interval Training    No No   Treadmill   MPH  1.5 2  2    Grade  0 0  0   Minutes  15 15  15    Recumbant Bike   Level 3  3 3 4    Minutes 15  15 15 15    NuStep   Level 2 2 2 3 5    Minutes 15 15 15 15 15    METs 1.7 2.3 2.6 2.3 2.3   Track   Laps 15       Minutes 15         02/14/16 1500 02/14/16 1600 02/19/16 1500 02/21/16 1600 02/26/16 1600   Exercise Review   Progression   Yes  Yes   Response to Exercise   Blood Pressure (Admit)  144/72 mmHg 140/72 mmHg 124/60 mmHg 112/60 mmHg   Blood Pressure (Exercise)  160/68 mmHg 160/60 mmHg  started on lasix  154/70 mmHg  130/70 mmHg   Blood Pressure (Exit)  140/60 mmHg 120/60 mmHg 122/60 mmHg 110/56 mmHg   Heart Rate (Admit)  89 bpm 80 bpm 91 bpm 97 bpm   Heart Rate (Exercise)  118 bpm 118 bpm 126 bpm 130 bpm   Heart Rate (Exit)  99 bpm 90 bpm 75 bpm 93 bpm   Oxygen Saturation (Admit)  96 % 96 % 94 % 95 %   Oxygen Saturation (Exercise)  91 % 92 % 90 % 91 %   Oxygen Saturation (Exit)  95 % 94 % 95 % 94 %   Rating of Perceived Exertion (Exercise)  11 11 11 11    Perceived Dyspnea (Exercise)  2 1 1 1    Comments Reviewed home exercise        Duration  Progress to 45 minutes of aerobic exercise without signs/symptoms of physical distress Progress to 45 minutes of aerobic exercise without signs/symptoms of physical distress Progress to 45 minutes of aerobic exercise without signs/symptoms of physical distress Progress to 45 minutes of aerobic exercise without signs/symptoms of physical distress   Intensity  THRR unchanged THRR unchanged THRR unchanged THRR unchanged   Progression   Progression  Continue to progress workloads to maintain intensity without signs/symptoms of physical distress. Continue to progress workloads to maintain intensity without signs/symptoms of physical distress. Continue to progress workloads to maintain intensity without signs/symptoms of physical distress. Continue to progress workloads to  maintain intensity without signs/symptoms of physical distress.   Resistance Training   Training Prescription  Yes Yes Yes Yes   Weight  orange bands orange bands orange bands orange bands   Reps  10-12 10-12 10-12 10-12   Interval Training   Interval Training  No No No No   Treadmill   MPH  2 2 2 2    Grade  0 0     Minutes  15 15 15 15    Recumbant Bike   Level   3.5 3.5 4   Minutes   15 15 15    NuStep   Level  5 5  6    Minutes  15 15  15    METs  2.7 2.5  2.5     02/28/16 1600 03/04/16 1500 03/13/16 1600 03/18/16 1600     Response to Exercise   Blood Pressure (Admit) 128/68 mmHg 106/60 mmHg 130/58 mmHg 104/62 mmHg    Blood Pressure (Exercise) 100/70 mmHg 168/60 mmHg 166/60 mmHg 108/56 mmHg    Blood Pressure (Exit) 104/60 mmHg 112/60 mmHg 136/60 mmHg 140/56 mmHg    Heart Rate (Admit) 96 bpm 82 bpm 79 bpm 85 bpm    Heart Rate (Exercise) 93 bpm 130 bpm 114 bpm 121 bpm    Heart Rate (Exit) 90 bpm 94 bpm 95 bpm 97 bpm    Oxygen Saturation (Admit) 96 % 96 % 93 % 95 %    Oxygen Saturation (Exercise) 90 % 91 % 93 % 93 %    Oxygen Saturation (Exit) 95 % 95 % 95 % 96 %    Rating of Perceived Exertion (Exercise) 11 11 11 12     Perceived Dyspnea (Exercise) 2 1 1 1     Duration Progress to 45 minutes of aerobic exercise without signs/symptoms of physical distress Progress to 45 minutes of aerobic exercise without signs/symptoms of physical distress Progress to 45 minutes of aerobic exercise without signs/symptoms of physical distress Progress to 45 minutes of aerobic exercise without signs/symptoms of physical distress    Intensity THRR unchanged THRR unchanged THRR  unchanged THRR unchanged    Progression   Progression Continue to progress workloads to maintain intensity without signs/symptoms of physical distress. Continue to progress workloads to maintain intensity without signs/symptoms of physical distress. Continue to progress workloads to maintain intensity without signs/symptoms of  physical distress. Continue to progress workloads to maintain intensity without signs/symptoms of physical distress.    Resistance Training   Training Prescription Yes Yes Yes Yes    Weight orange bands orange bands orange abnds orange abnds    Reps 10-12 10-12 10-12 10-12    Interval Training   Interval Training No No No No    Treadmill   MPH 2 2 2 2     Grade 0 0 0 0    Minutes 15 17 17 17     Recumbant Bike   Level 4 3.3  3.8    Watts    0    Minutes 15 17  17     NuStep   Level 5 6 6 6     Minutes 15 17 17 17     METs 2.6 2.8 2.5 2.9       Exercise Comments:     Exercise Comments      02/14/16 1521 02/21/16 0846 03/20/16 0840       Exercise Comments Home exercise reviewed today. Patient was advised to walk/bike 30-45 minutes 2-3 days a week. Patient is cont. to increase exercise intensity at home and at rehab. Monitoring BP closely.  Continues to exercise at home and is progressing well .  BP is improved with BP medication.        Discharge Exercise Prescription (Final Exercise Prescription Changes):     Exercise Prescription Changes - 03/18/16 1600    Response to Exercise   Blood Pressure (Admit) 104/62 mmHg   Blood Pressure (Exercise) 108/56 mmHg   Blood Pressure (Exit) 140/56 mmHg   Heart Rate (Admit) 85 bpm   Heart Rate (Exercise) 121 bpm   Heart Rate (Exit) 97 bpm   Oxygen Saturation (Admit) 95 %   Oxygen Saturation (Exercise) 93 %   Oxygen Saturation (Exit) 96 %   Rating of Perceived Exertion (Exercise) 12   Perceived Dyspnea (Exercise) 1   Duration Progress to 45 minutes of aerobic exercise without signs/symptoms of physical distress   Intensity THRR unchanged   Progression   Progression Continue to progress workloads to maintain intensity without signs/symptoms of physical distress.   Resistance Training   Training Prescription Yes   Weight orange abnds   Reps 10-12   Interval Training   Interval Training No   Treadmill   MPH 2   Grade 0   Minutes  17   Recumbant Bike   Level 3.8   Watts 0   Minutes 17   NuStep   Level 6   Minutes 17   METs 2.9       Nutrition:  Target Goals: Understanding of nutrition guidelines, daily intake of sodium 1500mg , cholesterol 200mg , calories 30% from fat and 7% or less from saturated fats, daily to have 5 or more servings of fruits and vegetables.  Biometrics:     Pre Biometrics - 01/14/16 1011    Pre Biometrics   Grip Strength 32 kg       Nutrition Therapy Plan and Nutrition Goals:     Nutrition Therapy & Goals - 02/14/16 1518    Nutrition Therapy   Diet General, healthful   Personal Nutrition Goals   Personal Goal #1 Promote slow wt loss of  0.5-2 lb/wk to a goal wt loss of 6-24 lb at graduation from [facility], educate and counsel regarding individualized specific dietary modifications aiming towards targeted core components such as weight, hypertension, lipid management, diabetes, heart failure and other comorbidities.   Expected Outcomes Short Term Goal: Understand basic principles of dietary content, such as calories, fat, sodium, cholesterol and nutrients.;Long Term Goal: Adherence to prescribed nutrition plan.      Nutrition Discharge: Rate Your Plate Scores:     Nutrition Assessments - 02/14/16 1516    Rate Your Plate Scores   Pre Score 62      Psychosocial: Target Goals: Acknowledge presence or absence of depression, maximize coping skills, provide positive support system. Participant is able to verbalize types and ability to use techniques and skills needed for reducing stress and depression.  Initial Review & Psychosocial Screening:     Initial Psych Review & Screening - 01/14/16 [place]? Yes   Barriers   Psychosocial barriers to participate in program There are no identifiable barriers or psychosocial needs.   Screening Interventions   Interventions Encouraged to  exercise      Quality of Life Scores:     Quality of Life - 01/23/16 1222    Quality of Life Scores   Health/Function Pre 25.17 %   Socioeconomic Pre 25.56 %   Psych/Spiritual Pre 24.93 %   Family Pre 27.88 %   GLOBAL Pre 25.76 %      PHQ-9:     Recent Review Flowsheet Data    Depression screen [facility] 2/9 01/14/2016   Decreased Interest 0   Down, Depressed, Hopeless 0   PHQ - 2 Score 0      Psychosocial Evaluation and Intervention:     Psychosocial Evaluation - 01/14/16 1016    Psychosocial Evaluation & Interventions   Interventions Encouraged to exercise with the program and follow exercise prescription   Continued Psychosocial Services Needed No      Psychosocial Re-Evaluation:     Psychosocial Re-Evaluation      02/12/16 1143 03/17/16 1147         Psychosocial Re-Evaluation   Interventions Encouraged to attend Pulmonary Rehabilitation for the exercise Encouraged to attend Pulmonary Rehabilitation for the exercise      Comments No psychosocial concerns have been identified. no psychosocial concerns identified at this time      Continued Psychosocial Services Needed No No        Education: Education Goals: Education classes will be provided on a weekly basis, covering required topics. Participant will state understanding/return demonstration of topics presented.  Learning Barriers/Preferences:     Learning Barriers/Preferences - 01/14/16 1006    Learning Barriers/Preferences   Learning Barriers Hearing  Deaf in right ear   Learning Preferences Individual Instruction;Skilled Demonstration      Education Topics: Risk Factor Reduction:  -Group instruction that is supported by a PowerPoint presentation. Instructor discusses the definition of a risk factor, different risk factors for pulmonary disease, and how the heart and lungs work together.     Nutrition for Pulmonary Patient:  -Group instruction provided by PowerPoint slides, verbal discussion, and  written materials to support subject matter. The instructor gives an explanation and review of healthy diet recommendations, which includes a discussion on weight management, recommendations for fruit and vegetable consumption, as well as protein, fluid, caffeine, fiber, sodium, sugar, and alcohol. Tips for eating when patients  are short of breath are discussed.          PULMONARY REHAB OTHER RESPIRATORY from 02/21/2016 in [place]   Date  02/07/16   Educator  RD   Instruction Review Code  2- meets goals/outcomes      Pursed Lip Breathing:  -Group instruction that is supported by demonstration and informational handouts. Instructor discusses the benefits of pursed lip and diaphragmatic breathing and detailed demonstration on how to preform both.        PULMONARY REHAB OTHER RESPIRATORY from 02/07/2016 in [place]   Date  01/31/16   Educator  EP   Instruction Review Code  2- meets goals/outcomes      Oxygen Safety:  -Group instruction provided by PowerPoint, verbal discussion, and written material to support subject matter. There is an overview of "What is Oxygen" and "Why do we need it".  Instructor also reviews how to create a safe environment for oxygen use, the importance of using oxygen as prescribed, and the risks of noncompliance. There is a brief discussion on traveling with oxygen and resources the patient may utilize.      PULMONARY REHAB OTHER RESPIRATORY from 03/13/2016 in [place]   Date  02/21/16   Educator  RN   Instruction Review Code  2- meets goals/outcomes      Oxygen Equipment:  -Group instruction provided by [facility] Staff utilizing handouts, written materials, and equipment demonstrations.   Signs and Symptoms:  -Group instruction provided by written material and verbal discussion to support subject matter. Warning signs and symptoms of infection, stroke, and heart  attack are reviewed and when to call the physician/911 reinforced. Tips for preventing the spread of infection discussed.   Advanced Directives:  -Group instruction provided by verbal instruction and written material to support subject matter. Instructor reviews Advanced Directive laws and proper instruction for filling out document.   Pulmonary Video:  -Group video education that reviews the importance of medication and oxygen compliance, exercise, good nutrition, pulmonary hygiene, and pursed lip and diaphragmatic breathing for the pulmonary patient.      PULMONARY REHAB OTHER RESPIRATORY from 02/21/2016 in [place]   Date  02/14/16   Instruction Review Code  2- meets goals/outcomes      Exercise for the Pulmonary Patient:  -Group instruction that is supported by a PowerPoint presentation. Instructor discusses benefits of exercise, core components of exercise, frequency, duration, and intensity of an exercise routine, importance of utilizing pulse oximetry during exercise, safety while exercising, and options of places to exercise outside of rehab.        PULMONARY REHAB OTHER RESPIRATORY from 03/13/2016 in [place]   Date  03/13/16   Educator  EP   Instruction Review Code  2- meets goals/outcomes      Pulmonary Medications:  -Verbally interactive group education provided by instructor with focus on inhaled medications and proper administration.   Anatomy and Physiology of the Respiratory System and Intimacy:  -Group instruction provided by PowerPoint, verbal discussion, and written material to support subject matter. Instructor reviews respiratory cycle and anatomical components of the respiratory system and their functions. Instructor also reviews differences in obstructive and restrictive respiratory diseases with examples of each. Intimacy, Sex, and Sexuality differences are reviewed with a discussion on how  relationships can change when diagnosed with pulmonary disease. Common sexual concerns are reviewed.   Knowledge  Questionnaire Score:     Knowledge Questionnaire Score - 01/23/16 1219    Knowledge Questionnaire Score   Pre Score 10/13      Core Components/Risk Factors/Patient Goals at Admission:     Personal Goals and Risk Factors at Admission - 01/14/16 1011    Core Components/Risk Factors/Patient Goals on Admission   Increase Strength and Stamina Yes   Intervention Provide advice, education, support and counseling about physical activity/exercise needs.;Develop an individualized exercise prescription for aerobic and resistive training based on initial evaluation findings, risk stratification, comorbidities and participant's personal goals.   Expected Outcomes Achievement of increased cardiorespiratory fitness and enhanced flexibility, muscular endurance and strength shown through measurements of functional capacity and personal statement of participant.   Improve shortness of breath with ADL's Yes   Intervention Provide education, individualized exercise plan and daily activity instruction to help decrease symptoms of SOB with activities of daily living.   Expected Outcomes Short Term: Achieves a reduction of symptoms when performing activities of daily living.   Develop more efficient breathing techniques such as purse lipped breathing and diaphragmatic breathing; and practicing self-pacing with activity Yes   Intervention Provide education, demonstration and support about specific breathing techniuqes utilized for more efficient breathing. Include techniques such as pursed lipped breathing, diaphragmatic breathing and self-pacing activity.   Expected Outcomes Short Term: Participant will be able to demonstrate and use breathing techniques as needed throughout daily activities.      Core Components/Risk Factors/Patient Goals Review:      Goals and Risk Factor Review      01/14/16  1015 02/12/16 1139 03/17/16 1143       Core Components/Risk Factors/Patient Goals Review   Personal Goals Review Increase Strength and Stamina;Improve shortness of breath with ADL's;Develop more efficient breathing techniques such as purse lipped breathing and diaphragmatic breathing and practicing self-pacing with activity. Increase Strength and Stamina;Improve shortness of breath with ADL's;Develop more efficient breathing techniques such as purse lipped breathing and diaphragmatic breathing and practicing self-pacing with activity. Increase Strength and Stamina;Improve shortness of breath with ADL's;Develop more efficient breathing techniques such as purse lipped breathing and diaphragmatic breathing and practicing self-pacing with activity.     Review  this is the first 30 day eval, still too early to see a big difference in strength and stamina, is enjoying exercising in program blood pressure is more controlled, up to level 6 on nustep and speed 2.0 on treadmill.  Says she has much more energy than before starting the program,     Expected Outcomes  She should see an improvement in her stamina in the next 30 days. Continue to increase workloads as tolerated, continue to increase her strength and stamina in the next 30 days.        Core Components/Risk Factors/Patient Goals at Discharge (Final Review):      Goals and Risk Factor Review - 03/17/16 1143    Core Components/Risk Factors/Patient Goals Review   Personal Goals Review Increase Strength and Stamina;Improve shortness of breath with ADL's;Develop more efficient breathing techniques such as purse lipped breathing and diaphragmatic breathing and practicing self-pacing with activity.   Review blood pressure is more controlled, up to level 6 on nustep and speed 2.0 on treadmill.  Says she has much more energy than before starting the program,   Expected Outcomes Continue to increase workloads as tolerated, continue to increase her strength  and stamina in the next 30 days.      ITP Comments:   Comments: ITP REVIEW  Pt is making expected progress toward personal goals after completing 13 sessions.   Recommend continued exercise, life style modification, education, and utilization of breathing techniques to increase stamina and strength and decrease shortness of breath with exertion.

## 2016-03-25 ENCOUNTER — Encounter (HOSPITAL_COMMUNITY)
Admission: RE | Admit: 2016-03-25 | Discharge: 2016-03-25 | Disposition: A | Discharge status: Home / Self Care | Payer: Medicare Other | Source: Ambulatory Visit | Attending: Pulmonary Disease | Admitting: Pulmonary Disease

## 2016-03-25 VITALS — Wt 229.9 lb

## 2016-03-25 DIAGNOSIS — J432 Centrilobular emphysema: Secondary | ICD-10-CM | POA: Diagnosis not present

## 2016-03-25 NOTE — Progress Notes (Signed)
Daily Session Note  Patient Details  Name: Meredith Marshall MRN: [id number] Date of Birth: [birth year] Referring Provider:        Pulmonary Rehab Walk Test from 01/17/2016 in [place]   Referring Provider  Dr. Halford Chessman      Encounter Date: 03/25/2016  Check In:     Session Check In - 03/25/16 1330    Check-In   Location [facility]   Staff Present Su Hilt, MS, ACSM RCEP, Exercise Physiologist;Annie Saephan Rollene Rotunda, Therapist, sports, BSN;Ramon Dredge, RN, MHA;Lisa Ysidro Evert, RN;Joan Leonia Reeves, RN, BSN   Supervising physician immediately available to respond to emergencies [facility] immediately available   Physician(s) Dr. Marily Memos   Medication changes reported     No   Fall or balance concerns reported    No   Warm-up and Cool-down Performed as group-led instruction   Resistance Training Performed Yes   VAD Patient? No   Pain Assessment   Currently in Pain? No/denies   Multiple Pain Sites No      Capillary Blood Glucose: No results found for this or any previous visit (from the past 24 hour(s)).      Exercise Prescription Changes - 03/25/16 1526    Response to Exercise   Blood Pressure (Admit) 114/60 mmHg   Blood Pressure (Exercise) 170/70 mmHg   Blood Pressure (Exit) 120/60 mmHg   Heart Rate (Admit) 73 bpm   Heart Rate (Exercise) 118 bpm   Heart Rate (Exit) 93 bpm   Oxygen Saturation (Admit) 95 %   Oxygen Saturation (Exercise) 93 %   Oxygen Saturation (Exit) 95 %   Rating of Perceived Exertion (Exercise) 13   Perceived Dyspnea (Exercise) 1   Duration Progress to 45 minutes of aerobic exercise without signs/symptoms of physical distress   Intensity THRR unchanged   Progression   Progression Continue to progress workloads to maintain intensity without signs/symptoms of physical distress.   Resistance Training   Training Prescription Yes   Weight orange bands   Reps 10-12  10 min   Interval Training   Interval Training No   Treadmill   MPH 2   Grade 0   Minutes 17   Recumbant Bike   Level 4   Watts 0   Minutes 17   NuStep   Level 6   Minutes 17   METs 2.6     Goals Met:  Independence with exercise equipment Improved SOB with ADL's Using PLB without cueing & demonstrates good technique Exercise tolerated well No report of cardiac concerns or symptoms Strength training completed today  Goals Unmet:  Not Applicable  Comments: Service time is from 1330 to 1500   Dr. Rush Farmer is Medical Director for Pulmonary Rehab at [facility].

## 2016-03-27 ENCOUNTER — Encounter (HOSPITAL_COMMUNITY)
Admission: RE | Admit: 2016-03-27 | Discharge: 2016-03-27 | Disposition: A | Discharge status: Home / Self Care | Payer: Medicare Other | Source: Ambulatory Visit | Attending: Pulmonary Disease | Admitting: Pulmonary Disease

## 2016-03-27 VITALS — Wt 229.3 lb

## 2016-03-27 DIAGNOSIS — J432 Centrilobular emphysema: Secondary | ICD-10-CM | POA: Diagnosis not present

## 2016-03-27 NOTE — Progress Notes (Signed)
Daily Session Note  Patient Details  Name: Meredith Marshall MRN: [id number] Date of Birth: 06-28-1941 Referring Provider:        Pulmonary Rehab Walk Test from 01/17/2016 in [place]   Referring Provider  Dr. Halford Chessman      Encounter Date: 03/27/2016  Check In:     Session Check In - 03/27/16 1330    Check-In   [facility]   Staff Present Rosebud Poles, RN, Luisa Hart, RN, BSN;Ramon Dredge, RN, MHA;Lisa Ysidro Evert, RN;Chianna Spirito, MS, ACSM RCEP, Exercise Physiologist   Supervising physician immediately available to respond to emergencies [facility] immediately available   Physician(s) Dr. Cruzita Lederer   Medication changes reported     No   Fall or balance concerns reported    No   Warm-up and Cool-down Performed as group-led instruction   Resistance Training Performed Yes   VAD Patient? No   Pain Assessment   Currently in Pain? No/denies   Multiple Pain Sites No      Capillary Blood Glucose: No results found for this or any previous visit (from the past 24 hour(s)).      Exercise Prescription Changes - 03/27/16 1600    Response to Exercise   Blood Pressure (Admit) 126/70 mmHg   Blood Pressure (Exercise) 130/80 mmHg   Blood Pressure (Exit) 112/60 mmHg   Heart Rate (Admit) 80 bpm   Heart Rate (Exercise) 123 bpm   Heart Rate (Exit) 90 bpm   Oxygen Saturation (Admit) 96 %   Oxygen Saturation (Exercise) 94 %   Oxygen Saturation (Exit) 90 %   Rating of Perceived Exertion (Exercise) 12   Perceived Dyspnea (Exercise) 1   Duration Progress to 45 minutes of aerobic exercise without signs/symptoms of physical distress   Intensity THRR unchanged   Progression   Progression Continue to progress workloads to maintain intensity without signs/symptoms of physical distress.   Resistance Training   Training Prescription Yes   Weight orange bands   Reps 10-12  10 min   Interval Training   Interval Training No   Treadmill   MPH 2   Grade 0   Minutes 17   Recumbant Bike   Level 4   Watts 0   Minutes 17     Goals Met:  Exercise tolerated well No report of cardiac concerns or symptoms Strength training completed today  Goals Unmet:  Not Applicable  Comments: Service time is from 1:30pm to 340pm    Dr. Rush Farmer is Medical Director for Pulmonary Rehab at [facility].

## 2016-04-01 ENCOUNTER — Encounter (HOSPITAL_COMMUNITY)
Admission: RE | Admit: 2016-04-01 | Discharge: 2016-04-01 | Disposition: A | Discharge status: Home / Self Care | Payer: Medicare Other | Source: Ambulatory Visit | Attending: Pulmonary Disease | Admitting: Pulmonary Disease

## 2016-04-01 VITALS — Wt 229.1 lb

## 2016-04-01 DIAGNOSIS — J432 Centrilobular emphysema: Secondary | ICD-10-CM

## 2016-04-01 NOTE — Progress Notes (Signed)
Daily Session Note  Patient Details  Name: Meredith Marshall MRN: [id number] Date of Birth: February 17, 1941 Referring Provider:   April Manson Pulmonary Rehab Walk Test from 01/17/2016 in [place]  Referring Provider  Dr. Halford Chessman      Encounter Date: 04/01/2016  Check In:     Session Check In - 04/01/16 1536      Check-In   [facility]   Staff Present Rosebud Poles, RN, BSN;Lisa Ysidro Evert, RN;Molly diVincenzo, MS, ACSM RCEP, Exercise Physiologist;Portia Rollene Rotunda, RN, BSN   Supervising physician immediately available to respond to emergencies [facility] immediately available   Physician(s) Dr. Marily Memos   Medication changes reported     No   Fall or balance concerns reported    No   Warm-up and Cool-down Performed as group-led instruction   Resistance Training Performed Yes   VAD Patient? No     Pain Assessment   Currently in Pain? No/denies   Multiple Pain Sites No      Capillary Blood Glucose: No results found for this or any previous visit (from the past 24 hour(s)).      Exercise Prescription Changes - 04/01/16 1500      Exercise Review   Progression Yes     Response to Exercise   Blood Pressure (Admit) 120/54   Blood Pressure (Exercise) 180/80   Blood Pressure (Exit) 120/50   Heart Rate (Admit) 75 bpm   Heart Rate (Exercise) 131 bpm   Heart Rate (Exit) 99 bpm   Oxygen Saturation (Admit) 95 %   Oxygen Saturation (Exercise) 90 %   Oxygen Saturation (Exit) 95 %   Rating of Perceived Exertion (Exercise) 12   Perceived Dyspnea (Exercise) 1   Duration Progress to 45 minutes of aerobic exercise without signs/symptoms of physical distress   Intensity THRR unchanged     Progression   Progression Continue to progress workloads to maintain intensity without signs/symptoms of physical distress.     Resistance Training   Training Prescription Yes   Weight orange bands   Reps 10-12  10 minutes of strength training     Interval Training   Interval Training No     Treadmill   MPH 2   Grade 0   Minutes 17     Recumbant Bike   Level 4.5   Minutes 17     NuStep   Level 6   Minutes 17   METs 2.7     Goals Met:  Exercise tolerated well Strength training completed today  Goals Unmet:  Not Applicable  Comments: Service time is from 1330 to 1510    Dr. Rush Farmer is Medical Director for Pulmonary Rehab at [facility].

## 2016-04-03 ENCOUNTER — Encounter (HOSPITAL_COMMUNITY)
Admission: RE | Admit: 2016-04-03 | Discharge: 2016-04-03 | Disposition: A | Discharge status: Home / Self Care | Payer: Medicare Other | Source: Ambulatory Visit | Attending: Pulmonary Disease | Admitting: Pulmonary Disease

## 2016-04-03 VITALS — Wt 229.5 lb

## 2016-04-03 DIAGNOSIS — J432 Centrilobular emphysema: Secondary | ICD-10-CM | POA: Diagnosis not present

## 2016-04-03 NOTE — Progress Notes (Signed)
Daily Session Note  Patient Details  Name: Meredith Marshall MRN: [id number] Date of Birth: 1941-03-13 Referring Provider:   April Manson Pulmonary Rehab Walk Test from 01/17/2016 in [place]  Referring Provider  Dr. Halford Chessman      Encounter Date: 04/03/2016  Check In:     Session Check In - 04/03/16 1330      Check-In   [facility]   Staff Present Rosebud Poles, RN, BSN;Ramon Dredge, RN, MHA;Portia Rollene Rotunda, RN, BSN;Molly diVincenzo, MS, ACSM RCEP, Exercise Physiologist   Supervising physician immediately available to respond to emergencies [facility] immediately available   Physician(s) Dr. Waldron Labs   Medication changes reported     No   Fall or balance concerns reported    No   Warm-up and Cool-down Performed as group-led instruction   Resistance Training Performed Yes   VAD Patient? No     Pain Assessment   Currently in Pain? No/denies   Multiple Pain Sites No      Capillary Blood Glucose: No results found for this or any previous visit (from the past 24 hour(s)).      Exercise Prescription Changes - 04/03/16 1500      Response to Exercise   Blood Pressure (Admit) 120/60   Blood Pressure (Exercise) 150/80   Blood Pressure (Exit) 124/54   Heart Rate (Admit) 85 bpm   Heart Rate (Exercise) 123 bpm   Heart Rate (Exit) 104 bpm   Oxygen Saturation (Admit) 95 %   Oxygen Saturation (Exercise) 91 %   Oxygen Saturation (Exit) 96 %   Rating of Perceived Exertion (Exercise) 12   Perceived Dyspnea (Exercise) 1   Duration Progress to 45 minutes of aerobic exercise without signs/symptoms of physical distress   Intensity THRR unchanged     Progression   Progression Continue to progress workloads to maintain intensity without signs/symptoms of physical distress.     Resistance Training   Training Prescription Yes   Weight orange bands   Reps 10-12  10 minutes of strength training      Interval Training   Interval Training No     Treadmill   MPH 2   Grade 0   Minutes 17     NuStep   Level 6   Minutes 17   METs 2.6     Goals Met:  Improved SOB with ADL's Exercise tolerated well Strength training completed today  Goals Unmet:  Not Applicable  Comments: Service time is from 1330 to 1505    Dr. Rush Farmer is Medical Director for Pulmonary Rehab at [facility]

## 2016-04-08 ENCOUNTER — Encounter (HOSPITAL_COMMUNITY)
Admission: RE | Admit: 2016-04-08 | Discharge: 2016-04-08 | Disposition: A | Discharge status: Home / Self Care | Payer: Medicare Other | Source: Ambulatory Visit | Attending: Pulmonary Disease | Admitting: Pulmonary Disease

## 2016-04-08 ENCOUNTER — Telehealth (HOSPITAL_COMMUNITY): Payer: Self-pay | Admitting: *Deleted

## 2016-04-08 VITALS — Wt 230.2 lb

## 2016-04-08 DIAGNOSIS — E785 Hyperlipidemia, unspecified: Secondary | ICD-10-CM | POA: Diagnosis not present

## 2016-04-08 DIAGNOSIS — I10 Essential (primary) hypertension: Secondary | ICD-10-CM | POA: Insufficient documentation

## 2016-04-08 DIAGNOSIS — K219 Gastro-esophageal reflux disease without esophagitis: Secondary | ICD-10-CM | POA: Diagnosis not present

## 2016-04-08 DIAGNOSIS — J449 Chronic obstructive pulmonary disease, unspecified: Secondary | ICD-10-CM | POA: Diagnosis not present

## 2016-04-08 DIAGNOSIS — Z87891 Personal history of nicotine dependence: Secondary | ICD-10-CM | POA: Insufficient documentation

## 2016-04-08 DIAGNOSIS — J432 Centrilobular emphysema: Secondary | ICD-10-CM | POA: Insufficient documentation

## 2016-04-08 DIAGNOSIS — M810 Age-related osteoporosis without current pathological fracture: Secondary | ICD-10-CM | POA: Insufficient documentation

## 2016-04-08 DIAGNOSIS — Z79899 Other long term (current) drug therapy: Secondary | ICD-10-CM | POA: Diagnosis not present

## 2016-04-08 NOTE — Progress Notes (Signed)
Daily Session Note  Patient Details  Name: Meredith Marshall MRN: [id number] Date of Birth: [birth year] Referring Provider:   April Manson Pulmonary Rehab Walk Test from 01/17/2016 in [place]  Referring Provider  Dr. Halford Chessman      Encounter Date: 04/08/2016  Check In:     Session Check In - 04/08/16 1339      Check-In   Location [facility]   Staff Present Su Hilt, MS, ACSM RCEP, Exercise Physiologist;Portia Rollene Rotunda, RN, Maxcine Ham, RN, Roque Cash, RN   Supervising physician immediately available to respond to emergencies [facility] immediately available   Physician(s) Dr. Marily Memos   Medication changes reported     No   Fall or balance concerns reported    No   Warm-up and Cool-down Performed as group-led instruction   Resistance Training Performed Yes   VAD Patient? No     Pain Assessment   Currently in Pain? No/denies   Multiple Pain Sites No      Capillary Blood Glucose: No results found for this or any previous visit (from the past 24 hour(s)).      Exercise Prescription Changes - 04/08/16 1600      Response to Exercise   Blood Pressure (Admit) 136/60   Blood Pressure (Exercise) 150/66   Blood Pressure (Exit) 110/64   Heart Rate (Admit) 82 bpm   Heart Rate (Exercise) 132 bpm   Heart Rate (Exit) 99 bpm   Oxygen Saturation (Admit) 96 %   Oxygen Saturation (Exercise) 92 %   Oxygen Saturation (Exit) 99 %   Rating of Perceived Exertion (Exercise) 12   Perceived Dyspnea (Exercise) 1   Duration Progress to 45 minutes of aerobic exercise without signs/symptoms of physical distress   Intensity THRR unchanged     Progression   Progression Continue to progress workloads to maintain intensity without signs/symptoms of physical distress.     Resistance Training   Training Prescription Yes   Weight orange bands   Reps 10-12  10 minutes of strength training     Interval Training   Interval Training  No     Treadmill   MPH 2.2   Grade 0   Minutes 17     Recumbant Bike   Level 4.5   Minutes 17     NuStep   Level 6   Minutes 17   METs 2.7     Goals Met:  Exercise tolerated well Strength training completed today  Goals Unmet:  HR  Comments: Service time is from 1330 to 1500    Dr. Rush Farmer is Medical Director for Pulmonary Rehab at [facility].

## 2016-04-10 ENCOUNTER — Encounter (HOSPITAL_COMMUNITY)
Admission: RE | Admit: 2016-04-10 | Discharge: 2016-04-10 | Disposition: A | Discharge status: Home / Self Care | Payer: Medicare Other | Source: Ambulatory Visit | Attending: Pulmonary Disease | Admitting: Pulmonary Disease

## 2016-04-10 VITALS — Wt 229.7 lb

## 2016-04-10 DIAGNOSIS — J432 Centrilobular emphysema: Secondary | ICD-10-CM | POA: Diagnosis not present

## 2016-04-10 NOTE — Progress Notes (Signed)
Daily Session Note  Patient Details  Name: Meredith Marshall MRN: [id number] Date of Birth: 1940-12-27 Referring Provider:   April Manson Pulmonary Rehab Walk Test from 01/17/2016 in [place]  Referring Provider  Dr. Halford Chessman      Encounter Date: 04/10/2016  Check In:     Session Check In - 04/10/16 1350      Check-In   Location [facility]   Staff Present Rosebud Poles, RN, BSN;Molly diVincenzo, MS, ACSM RCEP, Exercise Physiologist;Massiah Minjares Ysidro Evert, RN;Portia Rollene Rotunda, RN, BSN   Supervising physician immediately available to respond to emergencies [facility] immediately available   Physician(s) Dr. Marily Memos   Medication changes reported     No   Fall or balance concerns reported    No   Warm-up and Cool-down Performed as group-led instruction   Resistance Training Performed Yes   VAD Patient? No     Pain Assessment   Currently in Pain? No/denies   Multiple Pain Sites No      Capillary Blood Glucose: No results found for this or any previous visit (from the past 24 hour(s)).      Exercise Prescription Changes - 04/10/16 1500      Response to Exercise   Blood Pressure (Admit) 118/60   Blood Pressure (Exercise) 130/60   Blood Pressure (Exit) 114/60   Heart Rate (Admit) 81 bpm   Heart Rate (Exercise) 116 bpm   Heart Rate (Exit) 97 bpm   Oxygen Saturation (Admit) 94 %   Oxygen Saturation (Exercise) 93 %   Oxygen Saturation (Exit) 95 %   Rating of Perceived Exertion (Exercise) 12   Perceived Dyspnea (Exercise) 1   Duration Progress to 45 minutes of aerobic exercise without signs/symptoms of physical distress   Intensity THRR unchanged     Progression   Progression Continue to progress workloads to maintain intensity without signs/symptoms of physical distress.     Resistance Training   Training Prescription Yes   Weight orange bands   Reps 10-12  10 minutes of strength training     Interval Training   Interval Training  No     Recumbant Bike   Level 4.5   Minutes 17     NuStep   Level 6   Minutes 17   METs 2.7     Goals Met:  Exercise tolerated well No report of cardiac concerns or symptoms Strength training completed today  Goals Unmet:  Not Applicable  Comments: Service time is from 1330 to 1510. She attended Meditation class today with Jeanella Craze.    Dr. Rush Farmer is Medical Director for Pulmonary Rehab at [facility].

## 2016-04-15 ENCOUNTER — Encounter (HOSPITAL_COMMUNITY)
Admission: RE | Admit: 2016-04-15 | Discharge: 2016-04-15 | Disposition: A | Discharge status: Home / Self Care | Payer: Medicare Other | Source: Ambulatory Visit | Attending: Pulmonary Disease | Admitting: Pulmonary Disease

## 2016-04-15 VITALS — Wt 230.2 lb

## 2016-04-15 DIAGNOSIS — J432 Centrilobular emphysema: Secondary | ICD-10-CM | POA: Diagnosis not present

## 2016-04-15 NOTE — Progress Notes (Signed)
Daily Session Note  Patient Details  Name: Meredith Marshall MRN: [id number] Date of Birth: 1940/12/28 Referring Provider:   April Manson Pulmonary Rehab Walk Test from 01/17/2016 in [place]  Referring Provider  Dr. Halford Chessman      Encounter Date: 04/15/2016  Check In:     Session Check In - 04/15/16 1349      Check-In   Medication changes reported     Yes   Comments Coreg 6.25 1/2 tablet bid      Capillary Blood Glucose: No results found for this or any previous visit (from the past 24 hour(s)).      Exercise Prescription Changes - 04/15/16 1600      Exercise Review   Progression Yes     Response to Exercise   Blood Pressure (Admit) 130/60   Blood Pressure (Exercise) 148/80   Blood Pressure (Exit) 126/80   Heart Rate (Admit) 75 bpm   Heart Rate (Exercise) 102 bpm   Heart Rate (Exit) 98 bpm   Oxygen Saturation (Admit) 96 %   Oxygen Saturation (Exercise) 92 %   Oxygen Saturation (Exit) 95 %   Rating of Perceived Exertion (Exercise) 12   Perceived Dyspnea (Exercise) 1   Duration Progress to 45 minutes of aerobic exercise without signs/symptoms of physical distress   Intensity THRR unchanged     Progression   Progression Continue to progress workloads to maintain intensity without signs/symptoms of physical distress.     Resistance Training   Training Prescription Yes   Weight orange bands   Reps 10-12  10 minutes of strength training     Interval Training   Interval Training No     Treadmill   MPH 2.5   Grade 0   Minutes 17     Recumbant Bike   Level 4.5   Minutes 17     NuStep   Level 6   Minutes 17   METs 2.6     Goals Met:  Improved SOB with ADL's Using PLB without cueing & demonstrates good technique Exercise tolerated well Strength training completed today  Goals Unmet:  Not Applicable  Comments: Service time is from 1330 to 1500    Dr. Rush Farmer is Medical Director for Pulmonary Rehab at [facility].

## 2016-04-17 ENCOUNTER — Encounter (HOSPITAL_COMMUNITY)
Admission: RE | Admit: 2016-04-17 | Discharge: 2016-04-17 | Disposition: A | Discharge status: Home / Self Care | Payer: Medicare Other | Source: Ambulatory Visit | Attending: Pulmonary Disease | Admitting: Pulmonary Disease

## 2016-04-17 DIAGNOSIS — J432 Centrilobular emphysema: Secondary | ICD-10-CM

## 2016-04-17 NOTE — Progress Notes (Signed)
Pulmonary Individual Treatment Plan  Patient Details  Name: Meredith Marshall MRN: [id number] Date of Birth: [birth year] Referring Provider:   April Manson Pulmonary Rehab Walk Test from 01/17/2016 in [place]  Referring Provider  Dr. Halford Chessman      Initial Encounter Date:  Flowsheet Row Pulmonary Rehab Walk Test from 01/17/2016 in [place]  Date  01/17/16  Referring Provider  Dr. Halford Chessman      Visit Diagnosis: Centrilobular emphysema ([place])  Patient's Home Medications on Admission:   Current Outpatient Prescriptions:  .  acetaminophen (TYLENOL) 500 MG tablet, Take by mouth as needed.  , Disp: , Rfl:  .  albuterol (PROAIR HFA) 108 (90 BASE) MCG/ACT inhaler, Inhale 2 puffs into the lungs every 6 (six) hours as needed.  , Disp: , Rfl:  .  atorvastatin (LIPITOR) 20 MG tablet, Take 20 mg by mouth at bedtime.  , Disp: , Rfl:  .  b complex vitamins tablet, Take 1 tablet by mouth daily., Disp: , Rfl:  .  carvedilol (COREG) 6.25 MG tablet, Take 6.25 mg by mouth 2 (two) times daily with a meal., Disp: , Rfl:  .  Cholecalciferol (VITAMIN D) 1000 UNITS capsule, Take 2,000 Units by mouth. , Disp: , Rfl:  .  phenytoin (DILANTIN) 100 MG ER capsule, Take 100 mg by mouth 3 (three) times daily.  , Disp: , Rfl:  .  telmisartan (MICARDIS) 40 MG tablet, Take 40 mg by mouth daily.  , Disp: , Rfl:  .  tiotropium (SPIRIVA) 18 MCG inhalation capsule, Place 1 capsule (18 mcg total) into inhaler and inhale daily., Disp: 30 capsule, Rfl: 5  Past Medical History: Past Medical History:  Diagnosis Date  . Barrett esophagus    Questionable?  . Cervical spine disease   . Chronic cystic mastitis 1971  . COPD (chronic obstructive pulmonary disease) ([place])   . 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 GERD (gastroesophageal reflux disease)   . Hemorrhoids   . Hyperlipidemia   . Hypertension   . Osteoporosis   . 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 Thyroiditis 1989  . Tobacco abuse     Tobacco Use: History  Smoking Status  . Former Smoker  . Packs/day: 1.00  . Years: 35.00  . Types: Cigarettes  . Quit date: 11/06/2008  Smokeless Tobacco  . Never Used    Labs: Recent Review Flowsheet Data    There is no flowsheet data to display.      Capillary Blood Glucose: No results found for: GLUCAP   ADL UCSD:   Pulmonary Function Assessment:     Pulmonary Function Assessment - 01/14/16 1007      Breath   Bilateral Breath Sounds Clear   Shortness of Breath Yes      Exercise Target Goals:    Exercise Program Goal: Individual exercise prescription set with THRR, safety & activity barriers. Participant demonstrates ability to understand and report RPE using BORG scale, to self-measure pulse accurately, and to acknowledge the importance of the exercise prescription.  Exercise Prescription Goal: Starting with aerobic activity 30 plus minutes a day, 3 days per week for initial exercise prescription. Provide home exercise prescription and guidelines that participant acknowledges understanding prior to discharge.  Activity Barriers & Risk Stratification:     Activity Barriers & Cardiac Risk Stratification - 01/14/16 1005      Activity Barriers & Cardiac Risk  Stratification   Activity Barriers Arthritis;Joint Problems;Deconditioning;Muscular Weakness;Shortness of Breath      6 Minute Walk:     6 Minute Walk    Row Name 01/18/16 0739         6 Minute Walk   Phase Initial     Distance 1400 feet     Walk Time 6 minutes     # of Rest Breaks 0     MPH 2.65     METS 2.98     RPE 12     Perceived Dyspnea  3     VO2 Peak 10.4     Symptoms No     Resting HR 98 bpm     Resting BP 146/70     Max Ex. HR 141 bpm     Max Ex. BP 178/68     2 Minute Post BP 144/70       Interval HR   Baseline HR 98     1 Minute HR 115     2 Minute HR 130     3 Minute HR 137     4 Minute HR 140      5 Minute HR 134     6 Minute HR 141     2 Minute Post HR 115     Interval Heart Rate? Yes       Interval Oxygen   Baseline Oxygen Saturation % 95 %     Baseline Liters of Oxygen 0 L     1 Minute Oxygen Saturation % 89 %     1 Minute Liters of Oxygen 0 L     2 Minute Oxygen Saturation % 91 %     2 Minute Liters of Oxygen 0 L     3 Minute Oxygen Saturation % 92 %     3 Minute Liters of Oxygen 0 L     4 Minute Oxygen Saturation % 92 %     4 Minute Liters of Oxygen 0 L     5 Minute Oxygen Saturation % 92 %     5 Minute Liters of Oxygen 0 L     6 Minute Oxygen Saturation % 92 %     6 Minute Liters of Oxygen 0 L     2 Minute Post Oxygen Saturation % 95 %     2 Minute Post Liters of Oxygen 0 L        Initial Exercise Prescription:     Initial Exercise Prescription - 01/18/16 0700      Date of Initial Exercise RX and Referring Provider   Date 01/17/16   Referring Provider Dr. Halford Chessman     Oxygen   Oxygen --  room air     Recumbant Bike   Level 2   Watts 30   Minutes 15     NuStep   Level 2   Minutes 15   METs 1.5     Track   Laps 8   Minutes 15     Prescription Details   Frequency (times per week) 2   Duration Progress to 45 minutes of aerobic exercise without signs/symptoms of physical distress     Intensity   THRR 40-80% of Max Heartrate 58-116   Ratings of Perceived Exertion 11-13   Perceived Dyspnea 0-4     Progression   Progression Continue progressive overload as per policy without signs/symptoms or physical distress.     Resistance Training   Training Prescription Yes  Weight orange bands   Reps 10-12      Perform Capillary Blood Glucose checks as needed.  Exercise Prescription Changes:     Exercise Prescription Changes    Row Name 01/29/16 1500 01/31/16 1600 02/05/16 1524 02/07/16 1600 02/12/16 1500     Exercise Review   Progression Yes  - Yes Yes Yes     Response to Exercise   Blood Pressure (Admit) 140/60 166/86  recheck BP 138/70  128/70 140/80 122/60   Blood Pressure (Exercise) 138/62 160/66 150/70 178/84 178/60   Blood Pressure (Exit) 120/64 124/64 118/60 126/70 106/60   Heart Rate (Admit) 104 bpm 93 bpm 82 bpm 88 bpm 93 bpm   Heart Rate (Exercise) 118 bpm 116 bpm 116 bpm 114 bpm 120 bpm   Heart Rate (Exit) 103 bpm 93 bpm 89 bpm 93 bpm 86 bpm   Oxygen Saturation (Admit) 95 % 95 % 95 % 94 % 96 %   Oxygen Saturation (Exercise) 92 % 93 % 93 % 92 % 91 %   Oxygen Saturation (Exit) 95 % 95 % 95 % 96 % 96 %   Rating of Perceived Exertion (Exercise) 13 11 11 11 12    Perceived Dyspnea (Exercise) 1 1 1 1 1    Duration Progress to 45 minutes of aerobic exercise without signs/symptoms of physical distress Progress to 45 minutes of aerobic exercise without signs/symptoms of physical distress Progress to 45 minutes of aerobic exercise without signs/symptoms of physical distress Progress to 45 minutes of aerobic exercise without signs/symptoms of physical distress Progress to 45 minutes of aerobic exercise without signs/symptoms of physical distress   Intensity THRR unchanged THRR unchanged THRR unchanged THRR unchanged THRR unchanged     Progression   Progression Continue to progress workloads to maintain intensity without signs/symptoms of physical distress. Continue to progress workloads to maintain intensity without signs/symptoms of physical distress. Continue to progress workloads to maintain intensity without signs/symptoms of physical distress. Continue to progress workloads to maintain intensity without signs/symptoms of physical distress. Continue to progress workloads to maintain intensity without signs/symptoms of physical distress.     Resistance Training   Training Prescription Yes Yes Yes Yes Yes   Weight orange bands orange bands orange bands orange bands ornage bands   Reps 10-12 10-12 10-12 10-12 10-12     Interval Training   Interval Training  -  -  - No No     Treadmill   MPH  - 1.5 2  - 2   Grade  - 0 0  - 0    Minutes  - 15 15  - 15     Recumbant Bike   Level 3  - 3 3 4    Minutes 15  - 15 15 15      NuStep   Level 2 2 2 3 5    Minutes 15 15 15 15 15    METs 1.7 2.3 2.6 2.3 2.3     Track   Laps 15  -  -  -  -   Minutes 15  -  -  -  -   Row Name 02/14/16 1500 02/14/16 1600 02/19/16 1500 02/21/16 1600 02/26/16 1600     Exercise Review   Progression  -  - Yes  - Yes     Response to Exercise   Blood Pressure (Admit)  - 144/72 140/72 124/60 112/60   Blood Pressure (Exercise)  - 160/68 160/60  started on lasix  154/70 130/70   Blood Pressure (Exit)  -  140/60 120/60 122/60 110/56   Heart Rate (Admit)  - 89 bpm 80 bpm 91 bpm 97 bpm   Heart Rate (Exercise)  - 118 bpm 118 bpm 126 bpm 130 bpm   Heart Rate (Exit)  - 99 bpm 90 bpm 75 bpm 93 bpm   Oxygen Saturation (Admit)  - 96 % 96 % 94 % 95 %   Oxygen Saturation (Exercise)  - 91 % 92 % 90 % 91 %   Oxygen Saturation (Exit)  - 95 % 94 % 95 % 94 %   Rating of Perceived Exertion (Exercise)  - 11 11 11 11    Perceived Dyspnea (Exercise)  - 2 1 1 1    Comments Reviewed home exercise   -  -  -  -   Duration  - Progress to 45 minutes of aerobic exercise without signs/symptoms of physical distress Progress to 45 minutes of aerobic exercise without signs/symptoms of physical distress Progress to 45 minutes of aerobic exercise without signs/symptoms of physical distress Progress to 45 minutes of aerobic exercise without signs/symptoms of physical distress   Intensity  - THRR unchanged THRR unchanged THRR unchanged THRR unchanged     Progression   Progression  - Continue to progress workloads to maintain intensity without signs/symptoms of physical distress. Continue to progress workloads to maintain intensity without signs/symptoms of physical distress. Continue to progress workloads to maintain intensity without signs/symptoms of physical distress. Continue to progress workloads to maintain intensity without signs/symptoms of physical distress.      Resistance Training   Training Prescription  - Yes Yes Yes Yes   Weight  - orange bands orange bands orange bands orange bands   Reps  - 10-12 10-12 10-12 10-12     Interval Training   Interval Training  - No No No No     Treadmill   MPH  - 2 2 2 2    Grade  - 0 0  -  -   Minutes  - 15 15 15 15      Recumbant Bike   Level  -  - 3.5 3.5 4   Minutes  -  - 15 15 15      NuStep   Level  - 5 5  - 6   Minutes  - 15 15  - 15   METs  - 2.7 2.5  - 2.5   Row Name 02/28/16 1600 03/04/16 1500 03/13/16 1600 03/18/16 1600 03/25/16 1526     Response to Exercise   Blood Pressure (Admit) 128/68 106/60 130/58 104/62 114/60   Blood Pressure (Exercise) 100/70 168/60 166/60 108/56 170/70   Blood Pressure (Exit) 104/60 112/60 136/60 140/56 120/60   Heart Rate (Admit) 96 bpm 82 bpm 79 bpm 85 bpm 73 bpm   Heart Rate (Exercise) 93 bpm 130 bpm 114 bpm 121 bpm 118 bpm   Heart Rate (Exit) 90 bpm 94 bpm 95 bpm 97 bpm 93 bpm   Oxygen Saturation (Admit) 96 % 96 % 93 % 95 % 95 %   Oxygen Saturation (Exercise) 90 % 91 % 93 % 93 % 93 %   Oxygen Saturation (Exit) 95 % 95 % 95 % 96 % 95 %   Rating of Perceived Exertion (Exercise) 11 11 11 12 13    Perceived Dyspnea (Exercise) 2 1 1 1 1    Duration Progress to 45 minutes of aerobic exercise without signs/symptoms of physical distress Progress to 45 minutes of aerobic exercise without signs/symptoms of physical distress Progress to 45  minutes of aerobic exercise without signs/symptoms of physical distress Progress to 45 minutes of aerobic exercise without signs/symptoms of physical distress Progress to 45 minutes of aerobic exercise without signs/symptoms of physical distress   Intensity THRR unchanged THRR unchanged THRR unchanged THRR unchanged THRR unchanged     Progression   Progression Continue to progress workloads to maintain intensity without signs/symptoms of physical distress. Continue to progress workloads to maintain intensity without signs/symptoms of  physical distress. Continue to progress workloads to maintain intensity without signs/symptoms of physical distress. Continue to progress workloads to maintain intensity without signs/symptoms of physical distress. Continue to progress workloads to maintain intensity without signs/symptoms of physical distress.     Resistance Training   Training Prescription Yes Yes Yes Yes Yes   Weight orange bands orange bands orange abnds orange abnds orange bands   Reps 10-12 10-12 10-12 10-12 10-12  10 min     Interval Training   Interval Training No No No No No     Treadmill   MPH 2 2 2 2 2    Grade 0 0 0 0 0   Minutes 15 17 17 17 17      Recumbant Bike   Level 4 3.3  - 3.8 4   Watts  -  -  - 0 0   Minutes 15 17  - 17 17     NuStep   Level 5 6 6 6 6    Minutes 15 17 17 17 17    METs 2.6 2.8 2.5 2.9 2.6   Row Name 03/27/16 1600 04/01/16 1500 04/03/16 1500 04/08/16 1600 04/10/16 1500     Exercise Review   Progression  - Yes  -  -  -     Response to Exercise   Blood Pressure (Admit) 126/70 120/54 120/60 136/60 118/60   Blood Pressure (Exercise) 130/80 180/80 150/80 150/66 130/60   Blood Pressure (Exit) 112/60 120/50 124/54 110/64 114/60   Heart Rate (Admit) 80 bpm 75 bpm 85 bpm 82 bpm 81 bpm   Heart Rate (Exercise) 123 bpm 131 bpm 123 bpm 132 bpm 116 bpm   Heart Rate (Exit) 90 bpm 99 bpm 104 bpm 99 bpm 97 bpm   Oxygen Saturation (Admit) 96 % 95 % 95 % 96 % 94 %   Oxygen Saturation (Exercise) 94 % 90 % 91 % 92 % 93 %   Oxygen Saturation (Exit) 90 % 95 % 96 % 99 % 95 %   Rating of Perceived Exertion (Exercise) 12 12 12 12 12    Perceived Dyspnea (Exercise) 1 1 1 1 1    Duration Progress to 45 minutes of aerobic exercise without signs/symptoms of physical distress Progress to 45 minutes of aerobic exercise without signs/symptoms of physical distress Progress to 45 minutes of aerobic exercise without signs/symptoms of physical distress Progress to 45 minutes of aerobic exercise without  signs/symptoms of physical distress Progress to 45 minutes of aerobic exercise without signs/symptoms of physical distress   Intensity THRR unchanged THRR unchanged THRR unchanged THRR unchanged THRR unchanged     Progression   Progression Continue to progress workloads to maintain intensity without signs/symptoms of physical distress. Continue to progress workloads to maintain intensity without signs/symptoms of physical distress. Continue to progress workloads to maintain intensity without signs/symptoms of physical distress. Continue to progress workloads to maintain intensity without signs/symptoms of physical distress. Continue to progress workloads to maintain intensity without signs/symptoms of physical distress.     Resistance Training   Training Prescription Yes Yes  Yes Yes Yes   Weight orange bands orange bands orange bands orange bands orange bands   Reps 10-12  10 min 10-12  10 minutes of strength training 10-12  10 minutes of strength training  10-12  10 minutes of strength training 10-12  10 minutes of strength training     Interval Training   Interval Training No No No No No     Treadmill   MPH 2 2 2  2.2  -   Grade 0 0 0 0  -   Minutes 17 17 17 17   -     Recumbant Bike   Level 4 4.5  - 4.5 4.5   Watts 0  -  -  -  -   Minutes 17 17  - 17 17     NuStep   Level  - 6 6 6 6    Minutes  - 17 17 17 17    METs  - 2.7 2.6 2.7 2.7   Row Name 04/15/16 1600             Exercise Review   Progression Yes         Response to Exercise   Blood Pressure (Admit) 130/60       Blood Pressure (Exercise) 148/80       Blood Pressure (Exit) 126/80       Heart Rate (Admit) 75 bpm       Heart Rate (Exercise) 102 bpm       Heart Rate (Exit) 98 bpm       Oxygen Saturation (Admit) 96 %       Oxygen Saturation (Exercise) 92 %       Oxygen Saturation (Exit) 95 %       Rating of Perceived Exertion (Exercise) 12       Perceived Dyspnea (Exercise) 1       Duration Progress to 45  minutes of aerobic exercise without signs/symptoms of physical distress       Intensity THRR unchanged         Progression   Progression Continue to progress workloads to maintain intensity without signs/symptoms of physical distress.         Resistance Training   Training Prescription Yes       Weight orange bands       Reps 10-12  10 minutes of strength training         Interval Training   Interval Training No         Treadmill   MPH 2.5       Grade 0       Minutes 17         Recumbant Bike   Level 4.5       Minutes 17         NuStep   Level 6       Minutes 17       METs 2.6          Exercise Comments:     Exercise Comments    Row Name 02/14/16 1521 02/21/16 0846 03/20/16 0840 04/17/16 0828     Exercise Comments Home exercise reviewed today. Patient was advised to walk/bike 30-45 minutes 2-3 days a week. Patient is cont. to increase exercise intensity at home and at rehab. Monitoring BP closely.  Continues to exercise at home and is progressing well .  BP is improved with BP medication. Patient started off in program progressing well. Over the last few weeks patient has  been resistant to increases in workloads. Patient complains of knee pain and shortness of breath.       Discharge Exercise Prescription (Final Exercise Prescription Changes):     Exercise Prescription Changes - 04/15/16 1600      Exercise Review   Progression Yes     Response to Exercise   Blood Pressure (Admit) 130/60   Blood Pressure (Exercise) 148/80   Blood Pressure (Exit) 126/80   Heart Rate (Admit) 75 bpm   Heart Rate (Exercise) 102 bpm   Heart Rate (Exit) 98 bpm   Oxygen Saturation (Admit) 96 %   Oxygen Saturation (Exercise) 92 %   Oxygen Saturation (Exit) 95 %   Rating of Perceived Exertion (Exercise) 12   Perceived Dyspnea (Exercise) 1   Duration Progress to 45 minutes of aerobic exercise without signs/symptoms of physical distress   Intensity THRR unchanged     Progression    Progression Continue to progress workloads to maintain intensity without signs/symptoms of physical distress.     Resistance Training   Training Prescription Yes   Weight orange bands   Reps 10-12  10 minutes of strength training     Interval Training   Interval Training No     Treadmill   MPH 2.5   Grade 0   Minutes 17     Recumbant Bike   Level 4.5   Minutes 17     NuStep   Level 6   Minutes 17   METs 2.6       Nutrition:  Target Goals: Understanding of nutrition guidelines, daily intake of sodium 1500mg , cholesterol 200mg , calories 30% from fat and 7% or less from saturated fats, daily to have 5 or more servings of fruits and vegetables.  Biometrics:     Pre Biometrics - 01/14/16 1011      Pre Biometrics   Grip Strength 32 kg       Nutrition Therapy Plan and Nutrition Goals:     Nutrition Therapy & Goals - 02/14/16 1518      Nutrition Therapy   Diet General, healthful     Personal Nutrition Goals   Personal Goal #1 Promote slow wt loss of 0.5-2 lb/wk to a goal wt loss of 6-24 lb at graduation from [place], educate and counsel regarding individualized specific dietary modifications aiming towards targeted core components such as weight, hypertension, lipid management, diabetes, heart failure and other comorbidities.   Expected Outcomes Short Term Goal: Understand basic principles of dietary content, such as calories, fat, sodium, cholesterol and nutrients.;Long Term Goal: Adherence to prescribed nutrition plan.      Nutrition Discharge: Rate Your Plate Scores:     Nutrition Assessments - 02/14/16 1516      Rate Your Plate Scores   Pre Score 62      Psychosocial: Target Goals: Acknowledge presence or absence of depression, maximize coping skills, provide positive support system. Participant is able to verbalize types and ability to use techniques and skills needed for reducing stress and  depression.  Initial Review & Psychosocial Screening:     Initial Psych Review & Screening - 01/14/16 [place]? Yes     Barriers   Psychosocial barriers to participate in program There are no identifiable barriers or psychosocial needs.     Screening Interventions   Interventions Encouraged to exercise      Quality of Life  Scores:   PHQ-9: Recent Review Flowsheet Data    Depression screen [facility] 2/9 01/14/2016   Decreased Interest 0   Down, Depressed, Hopeless 0   PHQ - 2 Score 0      Psychosocial Evaluation and Intervention:     Psychosocial Evaluation - 01/14/16 1016      Psychosocial Evaluation & Interventions   Interventions Encouraged to exercise with the program and follow exercise prescription   Continued Psychosocial Services Needed No      Psychosocial Re-Evaluation:     Psychosocial Re-Evaluation    [place] Name 02/12/16 1143 03/17/16 1147 04/15/16 0826         Psychosocial Re-Evaluation   Interventions Encouraged to attend Pulmonary Rehabilitation for the exercise Encouraged to attend Pulmonary Rehabilitation for the exercise Encouraged to attend Pulmonary Rehabilitation for the exercise     Comments No psychosocial concerns have been identified. no psychosocial concerns identified at this time -  continues to have no psychosocial concerns     Continued Psychosocial Services Needed No No  -       Education: Education Goals: Education classes will be provided on a weekly basis, covering required topics. Participant will state understanding/return demonstration of topics presented.  Learning Barriers/Preferences:     Learning Barriers/Preferences - 01/14/16 1006      Learning Barriers/Preferences   Learning Barriers Hearing  Deaf in right ear   Learning Preferences Individual Instruction;Skilled Demonstration      Education Topics: Risk Factor Reduction:  -Group instruction that is supported by a  PowerPoint presentation. Instructor discusses the definition of a risk factor, different risk factors for pulmonary disease, and how the heart and lungs work together.     Nutrition for Pulmonary Patient:  -Group instruction provided by PowerPoint slides, verbal discussion, and written materials to support subject matter. The instructor gives an explanation and review of healthy diet recommendations, which includes a discussion on weight management, recommendations for fruit and vegetable consumption, as well as protein, fluid, caffeine, fiber, sodium, sugar, and alcohol. Tips for eating when patients are short of breath are discussed. Flowsheet Row PULMONARY REHAB OTHER RESPIRATORY from 04/03/2016 in [place]  Date  02/07/16  Educator  RD  Instruction Review Code  2- meets goals/outcomes      Pursed Lip Breathing:  -Group instruction that is supported by demonstration and informational handouts. Instructor discusses the benefits of pursed lip and diaphragmatic breathing and detailed demonstration on how to preform both.   Flowsheet Row PULMONARY REHAB OTHER RESPIRATORY from 04/03/2016 in [place]  Date  04/03/16  Educator  EP  Instruction Review Code  2- meets goals/outcomes      Oxygen Safety:  -Group instruction provided by PowerPoint, verbal discussion, and written material to support subject matter. There is an overview of "What is Oxygen" and "Why do we need it".  Instructor also reviews how to create a safe environment for oxygen use, the importance of using oxygen as prescribed, and the risks of noncompliance. There is a brief discussion on traveling with oxygen and resources the patient may utilize. Flowsheet Row PULMONARY REHAB OTHER RESPIRATORY from 04/03/2016 in [place]  Date  02/21/16  Educator  RN  Instruction Review Code  2- meets goals/outcomes      Oxygen Equipment:   -Group instruction provided by [facility] Staff utilizing handouts, written materials, and equipment demonstrations.   Signs and Symptoms:  -Group instruction provided by written  material and verbal discussion to support subject matter. Warning signs and symptoms of infection, stroke, and heart attack are reviewed and when to call the physician/911 reinforced. Tips for preventing the spread of infection discussed.   Advanced Directives:  -Group instruction provided by verbal instruction and written material to support subject matter. Instructor reviews Advanced Directive laws and proper instruction for filling out document.   Pulmonary Video:  -Group video education that reviews the importance of medication and oxygen compliance, exercise, good nutrition, pulmonary hygiene, and pursed lip and diaphragmatic breathing for the pulmonary patient. Flowsheet Row PULMONARY REHAB OTHER RESPIRATORY from 04/03/2016 in [place]  Date  02/14/16  Instruction Review Code  2- meets goals/outcomes      Exercise for the Pulmonary Patient:  -Group instruction that is supported by a PowerPoint presentation. Instructor discusses benefits of exercise, core components of exercise, frequency, duration, and intensity of an exercise routine, importance of utilizing pulse oximetry during exercise, safety while exercising, and options of places to exercise outside of rehab.   Flowsheet Row PULMONARY REHAB OTHER RESPIRATORY from 04/03/2016 in [place]  Date  03/13/16  Educator  EP  Instruction Review Code  2- meets goals/outcomes      Pulmonary Medications:  -Verbally interactive group education provided by instructor with focus on inhaled medications and proper administration.   Anatomy and Physiology of the Respiratory System and Intimacy:  -Group instruction provided by PowerPoint, verbal discussion, and written material to support subject  matter. Instructor reviews respiratory cycle and anatomical components of the respiratory system and their functions. Instructor also reviews differences in obstructive and restrictive respiratory diseases with examples of each. Intimacy, Sex, and Sexuality differences are reviewed with a discussion on how relationships can change when diagnosed with pulmonary disease. Common sexual concerns are reviewed. Flowsheet Row PULMONARY REHAB OTHER RESPIRATORY from 04/03/2016 in [place]  Date  03/27/16  Educator  RN  Instruction Review Code  2- meets goals/outcomes      Knowledge Questionnaire Score:   Core Components/Risk Factors/Patient Goals at Admission:     Personal Goals and Risk Factors at Admission - 01/14/16 1011      Core Components/Risk Factors/Patient Goals on Admission   Increase Strength and Stamina Yes   Intervention Provide advice, education, support and counseling about physical activity/exercise needs.;Develop an individualized exercise prescription for aerobic and resistive training based on initial evaluation findings, risk stratification, comorbidities and participant's personal goals.   Expected Outcomes Achievement of increased cardiorespiratory fitness and enhanced flexibility, muscular endurance and strength shown through measurements of functional capacity and personal statement of participant.   Improve shortness of breath with ADL's Yes   Intervention Provide education, individualized exercise plan and daily activity instruction to help decrease symptoms of SOB with activities of daily living.   Expected Outcomes Short Term: Achieves a reduction of symptoms when performing activities of daily living.   Develop more efficient breathing techniques such as purse lipped breathing and diaphragmatic breathing; and practicing self-pacing with activity Yes   Intervention Provide education, demonstration and support about specific breathing  techniuqes utilized for more efficient breathing. Include techniques such as pursed lipped breathing, diaphragmatic breathing and self-pacing activity.   Expected Outcomes Short Term: Participant will be able to demonstrate and use breathing techniques as needed throughout daily activities.      Core Components/Risk Factors/Patient Goals Review:      Goals and Risk Factor Review  [place] Name 01/14/16 1015 02/12/16 1139 03/17/16 1143 04/15/16 0823       Core Components/Risk Factors/Patient Goals Review   Personal Goals Review Increase Strength and Stamina;Improve shortness of breath with ADL's;Develop more efficient breathing techniques such as purse lipped breathing and diaphragmatic breathing and practicing self-pacing with activity. Increase Strength and Stamina;Improve shortness of breath with ADL's;Develop more efficient breathing techniques such as purse lipped breathing and diaphragmatic breathing and practicing self-pacing with activity. Increase Strength and Stamina;Improve shortness of breath with ADL's;Develop more efficient breathing techniques such as purse lipped breathing and diaphragmatic breathing and practicing self-pacing with activity.  -    Review  - this is the first 30 day eval, still too early to see a big difference in strength and stamina, is enjoying exercising in program blood pressure is more controlled, up to level 6 on nustep and speed 2.0 on treadmill.  Says she has much more energy than before starting the program, Started on coreg to decrease heart rate, have been unable to increase workloads on treadmill due to increased heart rate, up to level 6 on nustep, level 4.5 on recumbent bike    Expected Outcomes  - She should see an improvement in her stamina in the next 30 days. Continue to increase workloads as tolerated, continue to increase her strength and stamina in the next 30 days. Should be able to increase treadmill level when heart rate is controlled.        Core Components/Risk Factors/Patient Goals at Discharge (Final Review):      Goals and Risk Factor Review - 04/15/16 0823      Core Components/Risk Factors/Patient Goals Review   Review Started on coreg to decrease heart rate, have been unable to increase workloads on treadmill due to increased heart rate, up to level 6 on nustep, level 4.5 on recumbent bike   Expected Outcomes Should be able to increase treadmill level when heart rate is controlled.      ITP Comments:   Comments: ITP REVIEW Pt is making expected progress toward personal goals after completing 20sessions.   Recommend continued exercise, life style modification, education, and utilization of breathing techniques to increase stamina and strength and decrease shortness of breath with exertion.

## 2016-04-22 ENCOUNTER — Encounter (HOSPITAL_COMMUNITY)
Admission: RE | Admit: 2016-04-22 | Discharge: 2016-04-22 | Disposition: A | Discharge status: Home / Self Care | Payer: Medicare Other | Source: Ambulatory Visit | Attending: Pulmonary Disease | Admitting: Pulmonary Disease

## 2016-04-22 VITALS — Wt 231.3 lb

## 2016-04-22 DIAGNOSIS — J432 Centrilobular emphysema: Secondary | ICD-10-CM

## 2016-04-22 NOTE — Progress Notes (Signed)
Daily Session Note  Patient Details  Name: Meredith Marshall MRN: [id number] Date of Birth: 20-Nov-1940 Referring Provider:   April Manson Pulmonary Rehab Walk Test from 01/17/2016 in [place]  Referring Provider  Dr. Halford Chessman      Encounter Date: 04/22/2016  Check In:     Session Check In - 04/22/16 1347      Check-In   Location [facility]   Staff Present Rosebud Poles, RN, BSN;Molly diVincenzo, MS, ACSM RCEP, Exercise Physiologist;Lisa Ysidro Evert, Felipe Drone, RN, MHA;Portia Rollene Rotunda, RN, BSN   Supervising physician immediately available to respond to emergencies [facility] immediately available   Physician(s) Dr. Marily Memos   Medication changes reported     No   Fall or balance concerns reported    No   Warm-up and Cool-down Performed as group-led instruction   Resistance Training Performed Yes   VAD Patient? No     Pain Assessment   Currently in Pain? No/denies   Multiple Pain Sites No      Capillary Blood Glucose: No results found for this or any previous visit (from the past 24 hour(s)).      Exercise Prescription Changes - 04/22/16 1500      Response to Exercise   Blood Pressure (Admit) 144/74   Blood Pressure (Exercise) 160/80   Blood Pressure (Exit) 118/67   Heart Rate (Admit) 79 bpm   Heart Rate (Exercise) 107 bpm   Heart Rate (Exit) 89 bpm   Oxygen Saturation (Admit) 97 %   Oxygen Saturation (Exercise) 93 %   Oxygen Saturation (Exit) 95 %   Rating of Perceived Exertion (Exercise) 12   Perceived Dyspnea (Exercise) 1   Duration Progress to 45 minutes of aerobic exercise without signs/symptoms of physical distress   Intensity THRR unchanged     Progression   Progression Continue to progress workloads to maintain intensity without signs/symptoms of physical distress.     Resistance Training   Training Prescription Yes   Weight orange bands   Reps 10-12  10 minutes of strength training     Interval  Training   Interval Training No     Treadmill   MPH 2  very tired today, decreased workload on treadmill   Grade 0   Minutes 17     Recumbant Bike   Level 4.5   Minutes 17     NuStep   Level 6   Minutes 17   METs 2.2     Goals Met:  Exercise tolerated well Strength training completed today  Goals Unmet:  Not Applicable  Comments: Service time is from 1330 to 1510    Dr. Rush Farmer is Medical Director for Pulmonary Rehab at [facility].

## 2016-04-24 ENCOUNTER — Encounter (HOSPITAL_COMMUNITY)
Admission: RE | Admit: 2016-04-24 | Discharge: 2016-04-24 | Disposition: A | Discharge status: Home / Self Care | Payer: Medicare Other | Source: Ambulatory Visit | Attending: Pulmonary Disease | Admitting: Pulmonary Disease

## 2016-04-24 VITALS — Wt 231.7 lb

## 2016-04-24 DIAGNOSIS — J432 Centrilobular emphysema: Secondary | ICD-10-CM | POA: Diagnosis not present

## 2016-04-24 NOTE — Progress Notes (Signed)
Daily Session Note  Patient Details  Name: Meredith Marshall MRN: [id number] Date of Birth: 05-Sep-1941 Referring Provider:   April Manson Pulmonary Rehab Walk Test from 01/17/2016 in [place]  Referring Provider  Dr. Halford Chessman      Encounter Date: 04/24/2016  Check In:     Session Check In - 04/24/16 1242      Check-In   Location [facility]   Staff Present Rosebud Poles, RN, BSN;Molly diVincenzo, MS, ACSM RCEP, Exercise Physiologist;Mita Vallo Ysidro Evert, RN;Portia Rollene Rotunda, Therapist, sports, BSN;Ramon Dredge, RN, [facility]   Supervising physician immediately available to respond to emergencies [facility] immediately available   Physician(s) Dr. Marthenia Rolling   Medication changes reported     No   Warm-up and Cool-down Performed as group-led instruction   Resistance Training Performed Yes   VAD Patient? No     Pain Assessment   Currently in Pain? No/denies   Multiple Pain Sites No      Capillary Blood Glucose: No results found for this or any previous visit (from the past 24 hour(s)).      Exercise Prescription Changes - 04/24/16 1500      Exercise Review   Progression Yes     Response to Exercise   Blood Pressure (Admit) 146/70   Blood Pressure (Exercise) 160/66   Blood Pressure (Exit) 120/58   Heart Rate (Admit) 76 bpm   Heart Rate (Exercise) 119 bpm   Heart Rate (Exit) 90 bpm   Oxygen Saturation (Admit) 95 %   Oxygen Saturation (Exercise) 92 %   Oxygen Saturation (Exit) 94 %   Rating of Perceived Exertion (Exercise) 12   Perceived Dyspnea (Exercise) 1   Duration Progress to 45 minutes of aerobic exercise without signs/symptoms of physical distress   Intensity THRR unchanged     Progression   Progression Continue to progress workloads to maintain intensity without signs/symptoms of physical distress.     Resistance Training   Training Prescription Yes   Weight orange bands   Reps 10-12  10 minutes of strength training     Interval  Training   Interval Training No     Recumbant Bike   Level 5   Minutes 17     NuStep   Level 6   Minutes 17   METs 2.1     Goals Met:  Exercise tolerated well No report of cardiac concerns or symptoms Strength training completed today  Goals Unmet:  Not Applicable  Comments: Service time is from 1215 to 1445 Attended question and answer class with Dr. Nelda Marseille     Dr. Rush Farmer is Medical Director for Pulmonary Rehab at [facility].

## 2016-04-29 ENCOUNTER — Encounter (HOSPITAL_COMMUNITY)
Admission: RE | Admit: 2016-04-29 | Discharge: 2016-04-29 | Disposition: A | Discharge status: Home / Self Care | Payer: Medicare Other | Source: Ambulatory Visit | Attending: Pulmonary Disease | Admitting: Pulmonary Disease

## 2016-04-29 VITALS — Wt 231.0 lb

## 2016-04-29 DIAGNOSIS — J432 Centrilobular emphysema: Secondary | ICD-10-CM | POA: Diagnosis not present

## 2016-05-01 ENCOUNTER — Encounter (HOSPITAL_COMMUNITY): Admission: RE | Admit: 2016-05-01 | Payer: Medicare Other | Source: Ambulatory Visit

## 2016-05-06 ENCOUNTER — Ambulatory Visit (HOSPITAL_COMMUNITY)
Admission: RE | Admit: 2016-05-06 | Discharge: 2016-05-06 | Disposition: A | Discharge status: Home / Self Care | Payer: Medicare Other | Source: Ambulatory Visit | Attending: Vascular Surgery | Admitting: Vascular Surgery

## 2016-05-06 ENCOUNTER — Encounter (HOSPITAL_COMMUNITY): Payer: Medicare Other

## 2016-05-06 ENCOUNTER — Other Ambulatory Visit (HOSPITAL_COMMUNITY): Payer: Self-pay | Admitting: Internal Medicine

## 2016-05-06 DIAGNOSIS — I6529 Occlusion and stenosis of unspecified carotid artery: Secondary | ICD-10-CM

## 2016-05-08 ENCOUNTER — Encounter (HOSPITAL_COMMUNITY): Payer: Medicare Other

## 2016-05-15 ENCOUNTER — Telehealth: Payer: Self-pay | Admitting: Adult Health

## 2016-05-15 NOTE — Progress Notes (Signed)
Discharge Summary  Patient Details  Name: Meredith Marshall MRN: [id number] Date of Birth: 09-22-1940 Referring Provider:   April Manson Pulmonary Rehab Walk Test from 01/17/2016 in [place]  Referring Provider  Dr. Halford Chessman       Number of Visits: 22  Reason for Discharge:  Patient independent in their exercise.  Smoking History:  History  Smoking Status  . Former Smoker  . Packs/day: 1.00  . Years: 35.00  . Types: Cigarettes  . Quit date: 11/06/2008  Smokeless Tobacco  . Never Used    Diagnosis:  Centrilobular emphysema ([place])  ADL UCSD:     Pulmonary Assessment Scores    Row Name 04/24/16 1451         ADL UCSD   SOB Score total 44        Initial Exercise Prescription:     Initial Exercise Prescription - 01/18/16 0700      Date of Initial Exercise RX and Referring Provider   Date 01/17/16   Referring Provider Dr. Halford Chessman     Oxygen   Oxygen --  room air     Recumbant Bike   Level 2   Watts 30   Minutes 15     NuStep   Level 2   Minutes 15   METs 1.5     Track   Laps 8   Minutes 15     Prescription Details   Frequency (times per week) 2   Duration Progress to 45 minutes of aerobic exercise without signs/symptoms of physical distress     Intensity   THRR 40-80% of Max Heartrate 58-116   Ratings of Perceived Exertion 11-13   Perceived Dyspnea 0-4     Progression   Progression Continue progressive overload as per policy without signs/symptoms or physical distress.     Resistance Training   Training Prescription Yes   Weight orange bands   Reps 10-12      Discharge Exercise Prescription (Final Exercise Prescription Changes):     Exercise Prescription Changes - 04/24/16 1500      Exercise Review   Progression Yes     Response to Exercise   Blood Pressure (Admit) 146/70   Blood Pressure (Exercise) 160/66   Blood Pressure (Exit) 120/58   Heart Rate (Admit) 76 bpm   Heart Rate (Exercise) 119 bpm   Heart Rate (Exit) 90 bpm   Oxygen Saturation (Admit) 95 %   Oxygen Saturation (Exercise) 92 %   Oxygen Saturation (Exit) 94 %   Rating of Perceived Exertion (Exercise) 12   Perceived Dyspnea (Exercise) 1   Duration Progress to 45 minutes of aerobic exercise without signs/symptoms of physical distress   Intensity THRR unchanged     Progression   Progression Continue to progress workloads to maintain intensity without signs/symptoms of physical distress.     Resistance Training   Training Prescription Yes   Weight orange bands   Reps 10-12  10 minutes of strength training     Interval Training   Interval Training No     Recumbant Bike   Level 5   Minutes 17     NuStep   Level 6   Minutes 17   METs 2.1      Functional Capacity:     6 Minute Walk    Row Name 01/18/16 0739 04/29/16 1523       6 Minute Walk   Phase Initial Discharge    Distance 1400 feet 1300 feet  Walk Time 6 minutes 6 minutes    # of Rest Breaks 0 0    MPH 2.65 2.46    METS 2.98 2.84    RPE 12 12    Perceived Dyspnea  3 2    VO2 Peak 10.4  -    Symptoms No No    Resting HR 98 bpm 89 bpm    Resting BP 146/70 100/60    Max Ex. HR 141 bpm 140 bpm    Max Ex. BP 178/68 174/66    2 Minute Post BP 144/70 124/80      Interval HR   Baseline HR 98 89    1 Minute HR 115 114    2 Minute HR 130 123    3 Minute HR 137 124    4 Minute HR 140 140    5 Minute HR 134 139    6 Minute HR 141 139    2 Minute Post HR 115 93    Interval Heart Rate? Yes Yes      Interval Oxygen   Interval Oxygen?  - Yes    Baseline Oxygen Saturation % 95 % 95 %    Baseline Liters of Oxygen 0 L 0 L    1 Minute Oxygen Saturation % 89 % 95 %    1 Minute Liters of Oxygen 0 L 0 L    2 Minute Oxygen Saturation % 91 % 90 %    2 Minute Liters of Oxygen 0 L 0 L    3 Minute Oxygen Saturation % 92 % 93 %    3 Minute Liters of Oxygen 0 L 0 L    4 Minute Oxygen Saturation % 92 % 88 %    4 Minute Liters of Oxygen 0 L 0 L    5  Minute Oxygen Saturation % 92 % 88 %    5 Minute Liters of Oxygen 0 L 0 L    6 Minute Oxygen Saturation % 92 % 88 %    6 Minute Liters of Oxygen 0 L 0 L    2 Minute Post Oxygen Saturation % 95 % 97 %    2 Minute Post Liters of Oxygen 0 L 0 L       Psychological, QOL, Others - Outcomes: PHQ 2/9: Depression screen PHQ 2/9 01/14/2016  Decreased Interest 0  Down, Depressed, Hopeless 0  PHQ - 2 Score 0    Quality of Life:     Quality of Life - 04/24/16 1453      Quality of Life Scores   Health/Function Post 24.83 %   Socioeconomic Post 25.31 %   Psych/Spiritual Post 26.57 %   Family Post 24.75 %   GLOBAL Post 25.29 %      Personal Goals: Goals established at orientation with interventions provided to work toward goal.     Personal Goals and Risk Factors at Admission - 01/14/16 1011      Core Components/Risk Factors/Patient Goals on Admission   Increase Strength and Stamina Yes   Intervention Provide advice, education, support and counseling about physical activity/exercise needs.;Develop an individualized exercise prescription for aerobic and resistive training based on initial evaluation findings, risk stratification, comorbidities and participant's personal goals.   Expected Outcomes Achievement of increased cardiorespiratory fitness and enhanced flexibility, muscular endurance and strength shown through measurements of functional capacity and personal statement of participant.   Improve shortness of breath with ADL's Yes   Intervention Provide education, individualized exercise plan and  daily activity instruction to help decrease symptoms of SOB with activities of daily living.   Expected Outcomes Short Term: Achieves a reduction of symptoms when performing activities of daily living.   Develop more efficient breathing techniques such as purse lipped breathing and diaphragmatic breathing; and practicing self-pacing with activity Yes   Intervention Provide education,  demonstration and support about specific breathing techniuqes utilized for more efficient breathing. Include techniques such as pursed lipped breathing, diaphragmatic breathing and self-pacing activity.   Expected Outcomes Short Term: Participant will be able to demonstrate and use breathing techniques as needed throughout daily activities.       Personal Goals Discharge:     Goals and Risk Factor Review    Row Name 01/14/16 1015 02/12/16 1139 03/17/16 1143 04/15/16 0823       Core Components/Risk Factors/Patient Goals Review   Personal Goals Review Increase Strength and Stamina;Improve shortness of breath with ADL's;Develop more efficient breathing techniques such as purse lipped breathing and diaphragmatic breathing and practicing self-pacing with activity. Increase Strength and Stamina;Improve shortness of breath with ADL's;Develop more efficient breathing techniques such as purse lipped breathing and diaphragmatic breathing and practicing self-pacing with activity. Increase Strength and Stamina;Improve shortness of breath with ADL's;Develop more efficient breathing techniques such as purse lipped breathing and diaphragmatic breathing and practicing self-pacing with activity.  -    Review  - this is the first 30 day eval, still too early to see a big difference in strength and stamina, is enjoying exercising in program blood pressure is more controlled, up to level 6 on nustep and speed 2.0 on treadmill.  Says she has much more energy than before starting the program, Started on coreg to decrease heart rate, have been unable to increase workloads on treadmill due to increased heart rate, up to level 6 on nustep, level 4.5 on recumbent bike    Expected Outcomes  - She should see an improvement in her stamina in the next 30 days. Continue to increase workloads as tolerated, continue to increase her strength and stamina in the next 30 days. Should be able to increase treadmill level when heart rate is  controlled.       Nutrition & Weight - Outcomes:     Pre Biometrics - 01/14/16 1011      Pre Biometrics   Grip Strength 32 kg       Nutrition:     Nutrition Therapy & Goals - 02/14/16 1518      Nutrition Therapy   Diet General, healthful     Personal Nutrition Goals   Personal Goal #1 Promote slow wt loss of 0.5-2 lb/wk to a goal wt loss of 6-24 lb at graduation from [place], educate and counsel regarding individualized specific dietary modifications aiming towards targeted core components such as weight, hypertension, lipid management, diabetes, heart failure and other comorbidities.   Expected Outcomes Short Term Goal: Understand basic principles of dietary content, such as calories, fat, sodium, cholesterol and nutrients.;Long Term Goal: Adherence to prescribed nutrition plan.      Nutrition Discharge:     Nutrition Assessments - 05/08/16 1043      Rate Your Plate Scores   Pre Score 62   Post Score 64      Education Questionnaire Score:     Knowledge Questionnaire Score - 04/24/16 1450      Knowledge Questionnaire Score   Pre Score 10/13   Post Score 12/13  Goals reviewed with patient; copy given to patient.

## 2016-05-15 NOTE — Telephone Encounter (Signed)
Called spoke with patient who c/o hacking prod cough with gray mucus, increased SOB, wheezing x2 weeks.  Denies any tightness, f/c/s, hemoptysis, chest pain.  Pt requesting an appt - TP not in office until middle of next week Pt okay with seeing any provider - AD with a 12N opening tomorrow 05/16/16  Pt okay with this appt date/time Nothing further needed; will sign off

## 2016-05-16 ENCOUNTER — Other Ambulatory Visit (INDEPENDENT_AMBULATORY_CARE_PROVIDER_SITE_OTHER): Payer: Medicare Other

## 2016-05-16 ENCOUNTER — Ambulatory Visit (INDEPENDENT_AMBULATORY_CARE_PROVIDER_SITE_OTHER)
Admission: RE | Admit: 2016-05-16 | Discharge: 2016-05-16 | Disposition: A | Discharge status: Home / Self Care | Payer: Medicare Other | Source: Ambulatory Visit | Attending: Pulmonary Disease | Admitting: Pulmonary Disease

## 2016-05-16 ENCOUNTER — Ambulatory Visit (INDEPENDENT_AMBULATORY_CARE_PROVIDER_SITE_OTHER): Payer: Medicare Other | Admitting: Pulmonary Disease

## 2016-05-16 ENCOUNTER — Encounter: Payer: Self-pay | Admitting: Pulmonary Disease

## 2016-05-16 VITALS — BP 128/62 | HR 76 | Ht 64.0 in | Wt 230.0 lb

## 2016-05-16 DIAGNOSIS — J209 Acute bronchitis, unspecified: Secondary | ICD-10-CM | POA: Diagnosis not present

## 2016-05-16 DIAGNOSIS — E669 Obesity, unspecified: Secondary | ICD-10-CM | POA: Diagnosis not present

## 2016-05-16 DIAGNOSIS — J441 Chronic obstructive pulmonary disease with (acute) exacerbation: Secondary | ICD-10-CM | POA: Diagnosis not present

## 2016-05-16 LAB — CBC WITH DIFFERENTIAL/PLATELET
Basophils Absolute: 0 10*3/uL (ref 0.0–0.1)
Basophils Relative: 0.3 % (ref 0.0–3.0)
EOS ABS: 0.5 10*3/uL (ref 0.0–0.7)
Eosinophils Relative: 4.1 % (ref 0.0–5.0)
HEMATOCRIT: 36.4 % (ref 36.0–46.0)
Hemoglobin: 12.5 g/dL (ref 12.0–15.0)
Lymphocytes Relative: 10.5 % — ABNORMAL LOW (ref 12.0–46.0)
Lymphs Abs: 1.2 10*3/uL (ref 0.7–4.0)
MCHC: 34.4 g/dL (ref 30.0–36.0)
MCV: 85.5 fl (ref 78.0–100.0)
MONO ABS: 1.4 10*3/uL — AB (ref 0.1–1.0)
Monocytes Relative: 11.6 % (ref 3.0–12.0)
NEUTROS ABS: 8.6 10*3/uL — AB (ref 1.4–7.7)
NEUTROS PCT: 73.5 % (ref 43.0–77.0)
PLATELETS: 302 10*3/uL (ref 150.0–400.0)
RBC: 4.26 Mil/uL (ref 3.87–5.11)
RDW: 12.8 % (ref 11.5–15.5)
WBC: 11.8 10*3/uL — ABNORMAL HIGH (ref 4.0–10.5)

## 2016-05-16 LAB — COMPREHENSIVE METABOLIC PANEL
ALT: 14 U/L (ref 0–35)
AST: 15 U/L (ref 0–37)
Albumin: 3.8 g/dL (ref 3.5–5.2)
Alkaline Phosphatase: 89 U/L (ref 39–117)
BUN: 13 mg/dL (ref 6–23)
CALCIUM: 9.1 mg/dL (ref 8.4–10.5)
CHLORIDE: 85 meq/L — AB (ref 96–112)
CO2: 33 mEq/L — ABNORMAL HIGH (ref 19–32)
CREATININE: 0.6 mg/dL (ref 0.40–1.20)
GFR: 103.46 mL/min (ref >60.00)
GLUCOSE: 115 mg/dL — AB (ref 70–99)
Potassium: 4.6 mEq/L (ref 3.5–5.1)
SODIUM: 122 meq/L — AB (ref 135–145)
Total Bilirubin: 0.4 mg/dL (ref 0.2–1.2)
Total Protein: 7.4 g/dL (ref 6.0–8.3)

## 2016-05-16 MED ORDER — PREDNISONE 10 MG PO TABS: 20.0000 mg | ORAL_TABLET | Freq: Two times a day (BID) | ORAL | 0 refills | Status: AC

## 2016-05-16 MED ORDER — DOXYCYCLINE HYCLATE 100 MG PO TABS: 100.0000 mg | ORAL_TABLET | Freq: Two times a day (BID) | ORAL | 0 refills | Status: DC

## 2016-05-16 MED ORDER — ALBUTEROL SULFATE HFA 108 (90 BASE) MCG/ACT IN AERS: 2.0000 | INHALATION_SPRAY | Freq: Four times a day (QID) | RESPIRATORY_TRACT | 3 refills | Status: DC | PRN

## 2016-05-16 NOTE — Assessment & Plan Note (Signed)
Pt with copd, with 2 week h/o cough, congestion, wheezing, SOB, subjective chills. Some better since retstarting spiriva capsule. Not better on Mucinex. Some wheeze and rhonchi. Comfortable.   Plan : 1. Doxycycline, 100 mg, twice a day for 7 days. 2. Prednisone, 20 mg a day for 7 days. 3. Continue with Spiriva capsule daily. 4. Continue with albuterol, 2 puffs every 4 hours as needed for dyspnea. I told her to try this 3-4 times a day for the next couple of days. 5. Chest x-ray, CBC, CMP, sputum sample. If x-rays positive for pneumonia, will switch antibiotic to levofloxacin. 6. My concern is she is not on LABA or inhaled steroids as she is just on anticholinergic when necessary. I stressed this out to the patient. If she is not better over the weekend, she will give [place] a call Monday and maybe can be started on Symbicort or Dulera. She is conservative with medicines and wants to avoid medicines as much as possible. Then again, if she is significantly worse over the weekend, she was instructed to go to the emergency room. 7. Follow up with Dr. Halford Chessman as scheduled, sooner if not better.  8. She received a flu vaccine 2 weeks ago.

## 2016-05-16 NOTE — Patient Instructions (Signed)
It was a pleasure taking care of you today!  You are diagnosed with bronchitis.  We will obtain blood work (CBC, BMP) and sputum sample and Chest Xray.   Make sure you take your antibiotics:  Doxycycline, 100 mg per capsule, 1 capsule twice a day for 7 days. Start prednisone at 20 mg per day, one tablet a day for 7 days. Continue using her Spiriva daily. Continue using albuterol 2puffs every 4 hours as needed.  We discussed potential side effects/adverse reactions of antibiotics, including, but not limited to: rash, diarrhea.  Please call the office if you are having adverse reaction to meds/antibiotics.  Please call the office your symptoms are getting worse despite the meds/antibiotics.    Return to clinic with Dr. Halford Chessman as scheduled, sooner if not better.

## 2016-05-16 NOTE — Progress Notes (Signed)
Subjective:    Patient ID: Meredith Marshall, female    DOB: 10-06-1940, 75 y.o.   MRN: [id number]  HPI Patient is being seen in the office for COPD.  ROV 05/16/16 Pt has had cough x 2 weeks.  She finished rehab at that time. Has gray to white sputum. (-) fevers. Subjective chills. Pt also with worsening SOB x 2 weeks. With recent wheezing also. (-) sick contacts other than people in rehab. She tried Mucinex with no significant improvement.  She restarted spiriva capsule x 1 week with some improvement. Recent flare in Dec 2016 until Feb 2017.    Review of Systems  Constitutional: Positive for diaphoresis.  HENT: Negative.   Eyes: Negative.   Respiratory: Positive for cough, chest tightness, shortness of breath and wheezing.   Cardiovascular: Negative.   Gastrointestinal: Negative.   Endocrine: Negative.   Genitourinary: Negative.   Musculoskeletal: Negative.   Skin: Negative.   Allergic/Immunologic: Negative.   Hematological: Negative.   Psychiatric/Behavioral: Negative.        Objective:   Physical Exam Vitals:  Vitals:   05/16/16 1200  BP: 128/62  Pulse: 76  SpO2: 95%  Weight: 230 lb (104.3 kg)  Height: 5\' 4"  (1.626 m)    Constitutional/General:  Pleasant, well-nourished, well-developed, not in any distress,  Comfortably seating.  Well kempt  Body mass index is 39.48 kg/m. Wt Readings from Last 3 Encounters:  05/16/16 230 lb (104.3 kg)  04/29/16 231 lb 0.7 oz (104.8 kg)  04/24/16 231 lb 11.3 oz (105.1 kg)     HEENT: Pupils equal and reactive to light and accommodation. Anicteric sclerae. Normal nasal mucosa.   No oral  lesions,  mouth clear,  oropharynx clear, no postnasal drip. (-) Oral thrush. No dental caries.  Airway - Mallampati class III-IV  Neck: No masses. Midline trachea. No JVD, (-) LAD. (-) bruits appreciated.  Respiratory/Chest: Grossly normal chest. (-) deformity. (-) Accessory muscle use.  Symmetric expansion. (-) Tenderness on palpation.    Resonant on percussion.  Diminished BS on both lower lung zones. (-) crackles, (+) occasional wheezing posteriorly. Some rhonchi in BULF which decrease with coughing.  (-) egophony  Cardiovascular: Regular rate and  rhythm, heart sounds normal, no murmur or gallops, no peripheral edema  Gastrointestinal:  Normal bowel sounds. Soft, non-tender. No hepatosplenomegaly.  (-) masses.   Musculoskeletal:  Normal muscle tone. Normal gait.   Extremities: Grossly normal. (-) clubbing, cyanosis.  (-) edema  Skin: (-) rash,lesions seen.   Neurological/Psychiatric : alert, oriented to time, place, person. Normal mood and affect           Assessment & Plan:  Acute bronchitis Pt with copd, with 2 week h/o cough, congestion, wheezing, SOB, subjective chills. Some better since retstarting spiriva capsule. Not better on Mucinex. Some wheeze and rhonchi. Comfortable.   Plan : 1. Doxycycline, 100 mg, twice a day for 7 days. 2. Prednisone, 20 mg a day for 7 days. 3. Continue with Spiriva capsule daily. 4. Continue with albuterol, 2 puffs every 4 hours as needed for dyspnea. I told her to try this 3-4 times a day for the next couple of days. 5. Chest x-ray, CBC, CMP, sputum sample. If x-rays positive for pneumonia, will switch antibiotic to levofloxacin. 6. My concern is she is not on LABA or inhaled steroids as she is just on anticholinergic when necessary. I stressed this out to the patient. If she is not better over the weekend, she will give [place] a  call Monday and maybe can be started on Symbicort or Dulera. She is conservative with medicines and wants to avoid medicines as much as possible. Then again, if she is significantly worse over the weekend, she was instructed to go to the emergency room. 7. Follow up with Dr. Halford Chessman as scheduled, sooner if not better.  8. She received a flu vaccine 2 weeks ago.  COPD exacerbation (HCC) Recent acute exacerbation of COPD secondary to bronchitis. Start  prednisone. Continue Spiriva. Continue albuterol. May need Symbicort or Dulera  if not better in the next couple days.  Obesity Weight reduction  J. Shirl Harris, MD 05/16/2016, 12:37 PM [facility] Pager [phone] After 3 pm or if no answer, call [phone]

## 2016-05-16 NOTE — Assessment & Plan Note (Signed)
Weight reduction 

## 2016-05-16 NOTE — Assessment & Plan Note (Signed)
Recent acute exacerbation of COPD secondary to bronchitis. Start prednisone. Continue Spiriva. Continue albuterol. May need Symbicort or Dulera  if not better in the next couple days.

## 2016-05-19 ENCOUNTER — Telehealth: Payer: Self-pay | Admitting: Gastroenterology

## 2016-05-19 ENCOUNTER — Telehealth: Payer: Self-pay | Admitting: Pulmonary Disease

## 2016-05-19 NOTE — Progress Notes (Signed)
LM with pt's husband to have pt return call.  

## 2016-05-19 NOTE — Telephone Encounter (Signed)
Meredith Marshall was advised that this is the GI office and NOT pulmonary.  I looked up the number for her to [facility].

## 2016-05-19 NOTE — Telephone Encounter (Signed)
Notes Recorded by Osa Craver, CMA on 05/19/2016 at 11:42 AM EDT LM with pt's husband to have pt return call. ------ Notes Recorded by Rush Landmark, MD on 05/16/2016 at 8:49 PM EDT pls tell pt CXR did NOT show PNA, only a small effusion in the RLL. F/U with Dr. Halford Chessman as scheduled unless worse with SOB. May need rpt CXR on f/u. -------------------------------------------------------------------------------- Spoke with pt. She is aware of results. Nothing further was needed.

## 2016-05-20 LAB — GRAM STAIN/BODY FLUID CULTURE

## 2016-05-28 ENCOUNTER — Telehealth: Payer: Self-pay | Admitting: Pulmonary Disease

## 2016-05-28 DIAGNOSIS — J189 Pneumonia, unspecified organism: Secondary | ICD-10-CM

## 2016-05-28 LAB — GRAM STAIN W/SPUTUM CULT RFLX

## 2016-05-28 LAB — SPUTUM CULTURE

## 2016-05-28 LAB — SPECIMEN STATUS REPORT

## 2016-05-28 NOTE — Telephone Encounter (Signed)
Ct has been scheduled by [place]. Nothing further is needed.

## 2016-05-28 NOTE — Telephone Encounter (Signed)
CT ordered. Routed to [facility] to schedule.

## 2016-05-28 NOTE — Telephone Encounter (Signed)
   Patient was recently seen for bronchitis. I empirically placed her on doxycycline for a week. She had a flareup of her COPD as well. COPD is better but she still coughs, dry.  Has some right back pain. Denies fevers, chills. Breathing is better.   Sputum showed heavy growth of Serratia. CXR with no obvious PNA.   I discussed results with the patient. We decided we will get a chest CT scan to better look at her lung parenchyma. I'm not sure if she has an occult pneumonia or infection which was not treated with doxycycline. If ever, if she has infection or pneumonia, she'll probably need 1-2 weeks of ciprofloxacin for which the Serratia is sensitive to.  LBPU >> Can you pls  Schedule pt for urgent chest CT scan, no contrast? Thanks! Dx is PNA.    Monica Becton, MD 05/28/2016, 1:45 PM [place] [facility] Pager [phone] After 3 pm or if no answer, call [phone]

## 2016-05-28 NOTE — Telephone Encounter (Signed)
Called spoke with pt. She states that AD called and spoke with her husband about a sputum sample and requested the pt return his call. I explained to her that I did not see any message in her chart where AD had called but that I would send a message to him to make him aware she was returning his call. She voiced understanding and had no further questions.   AD please advise

## 2016-05-29 ENCOUNTER — Ambulatory Visit (INDEPENDENT_AMBULATORY_CARE_PROVIDER_SITE_OTHER)
Admission: RE | Admit: 2016-05-29 | Discharge: 2016-05-29 | Disposition: A | Discharge status: Home / Self Care | Payer: Medicare Other | Source: Ambulatory Visit | Attending: Pulmonary Disease | Admitting: Pulmonary Disease

## 2016-05-29 ENCOUNTER — Telehealth: Payer: Self-pay | Admitting: Pulmonary Disease

## 2016-05-29 DIAGNOSIS — J189 Pneumonia, unspecified organism: Secondary | ICD-10-CM

## 2016-05-29 MED ORDER — CIPROFLOXACIN HCL 500 MG PO TABS: 500.0000 mg | ORAL_TABLET | Freq: Two times a day (BID) | ORAL | 0 refills | Status: AC

## 2016-05-29 NOTE — Telephone Encounter (Signed)
   Chest ct scan done today reviewed and d/w pt. No PNA. COPD chnages. Small R effusion which was seen in 05/2016 and 08/2015 CXR.   Pt still with nagging cough, prod of yellow-white sputum.   Will Rx Cipro 500 mg BID (bronchitis) x 1 week. Told pt to call back if not better. D/w pt side effects of cipro.   Pt has a f/u with Dr. Halford Chessman in Oct.   Sheena, can we order cipro pls?   J. Shirl Harris, MD 05/29/2016, 3:11 PM [place] [facility] Pager [phone] After 3 pm or if no answer, call [phone]

## 2016-05-29 NOTE — Telephone Encounter (Signed)
Rx sent. AD spoke with pt. Nothing further needed.

## 2016-05-30 NOTE — Telephone Encounter (Signed)
   05/29/16 3:16 PM  Note    Rx sent. AD spoke with pt. Nothing further needed.

## 2016-07-02 ENCOUNTER — Encounter: Payer: Self-pay | Admitting: Pulmonary Disease

## 2016-07-02 ENCOUNTER — Ambulatory Visit (INDEPENDENT_AMBULATORY_CARE_PROVIDER_SITE_OTHER): Payer: Medicare Other | Admitting: Pulmonary Disease

## 2016-07-02 ENCOUNTER — Ambulatory Visit (INDEPENDENT_AMBULATORY_CARE_PROVIDER_SITE_OTHER)
Admission: RE | Admit: 2016-07-02 | Discharge: 2016-07-02 | Disposition: A | Discharge status: Home / Self Care | Payer: Medicare Other | Source: Ambulatory Visit | Attending: Pulmonary Disease | Admitting: Pulmonary Disease

## 2016-07-02 VITALS — BP 132/84 | HR 81 | Ht 63.75 in | Wt 228.0 lb

## 2016-07-02 DIAGNOSIS — J432 Centrilobular emphysema: Secondary | ICD-10-CM | POA: Diagnosis not present

## 2016-07-02 DIAGNOSIS — J90 Pleural effusion, not elsewhere classified: Secondary | ICD-10-CM | POA: Diagnosis not present

## 2016-07-02 DIAGNOSIS — R05 Cough: Secondary | ICD-10-CM | POA: Diagnosis not present

## 2016-07-02 DIAGNOSIS — R059 Cough, unspecified: Secondary | ICD-10-CM

## 2016-07-02 DIAGNOSIS — J441 Chronic obstructive pulmonary disease with (acute) exacerbation: Secondary | ICD-10-CM | POA: Diagnosis not present

## 2016-07-02 LAB — NITRIC OXIDE: NITRIC OXIDE: 28

## 2016-07-02 MED ORDER — BUDESONIDE-FORMOTEROL FUMARATE 160-4.5 MCG/ACT IN AERO: 2.0000 | INHALATION_SPRAY | Freq: Two times a day (BID) | RESPIRATORY_TRACT | 6 refills | Status: DC

## 2016-07-02 MED ORDER — BUDESONIDE-FORMOTEROL FUMARATE 160-4.5 MCG/ACT IN AERO: 2.0000 | INHALATION_SPRAY | Freq: Two times a day (BID) | RESPIRATORY_TRACT | 0 refills | Status: DC

## 2016-07-02 MED ORDER — LEVOFLOXACIN 750 MG PO TABS: 750.0000 mg | ORAL_TABLET | ORAL | 0 refills | Status: DC

## 2016-07-02 NOTE — Addendum Note (Signed)
Addended by: Virl Cagey on: 07/02/2016 05:15 PM   Modules accepted: Orders

## 2016-07-02 NOTE — Progress Notes (Signed)
Current Outpatient Prescriptions on File Prior to Visit  Medication Sig  . acetaminophen (TYLENOL) 500 MG tablet Take by mouth as needed.    [name] Kitchen albuterol (PROAIR HFA) 108 (90 Base) MCG/ACT inhaler Inhale 2 puffs into the lungs every 6 (six) hours as needed.  [name] Kitchen atorvastatin (LIPITOR) 20 MG tablet Take 20 mg by mouth at bedtime.    [name] Kitchen b complex vitamins tablet Take 1 tablet by mouth daily.  . carvedilol (COREG) 6.25 MG tablet Take 6.25 mg by mouth 2 (two) times daily with a meal.  . Cholecalciferol (VITAMIN D) 1000 UNITS capsule Take 2,000 Units by mouth.   . hydrochlorothiazide (HYDRODIURIL) 12.5 MG tablet Take 12.5 mg by mouth daily.  . phenytoin (DILANTIN) 100 MG ER capsule Take 100 mg by mouth 3 (three) times daily.    [name] Kitchen telmisartan (MICARDIS) 40 MG tablet Take 40 mg by mouth daily.    [name] Kitchen tiotropium (SPIRIVA) 18 MCG inhalation capsule Place 1 capsule (18 mcg total) into inhaler and inhale daily.  . [DISCONTINUED] DEXILANT 60 MG capsule Take 60 mg by mouth daily as needed.    No current facility-administered medications on file prior to visit.     Chief Complaint  Patient presents with  . Follow-up    Pt states that she has had a cough, chest tightness and SOB x 2 months with mucus production  white to grey in color; 24/7. Pt denies any yellow/green mucus. Denies fever. Using Mucinex PRN and Spiriva PRN.    Pulmonary tests PFT 06/04/09 >> FEV1 1.80(87%), FVC 2.96(103%), FEV1% 61, TLC 5.29(110%), DLCO 66%, no BD Spirometry 09/21/13 >> FEV1 1.49 (70%), FEV1% 61 CT chest 05/29/16 >> small Rt effusion FeNO 07/02/16 >> 28  Past medical history GERD, Seizure 1988, HLD, HTN  Past surgical history, Family history, Social history, Allergies reviewed  Vital signs BP 132/84 (BP Location: Left Arm, Cuff Size: Normal)   Pulse 81   Ht 5' 3.75" (1.619 m)   Wt 228 lb (103.4 kg)   SpO2 95%   BMI 39.44 kg/m   History of Present Illness: Meredith Marshall is a 75 y.o. female former smoker with  COPD.  She was seen in September by Dr. Corrie Dandy.  She was found to have Serretia in her sputum.  She was treated with cipro and prednisone.    She still has cough with chest congestion.  She is still bringing up grey to clear sputum.  She gets episodes of wheezing.  This is worse at night.  She is not having fever, chest pain, or hemoptysis.  She has felt more fatigued.  She started using spiriva again and this helps.  Physical Exam:  General - No distress ENT - No sinus tenderness, no oral exudate, no LAN Cardiac - s1s2 regular, no murmur Chest - b/l expiratory wheeze and squeaks with partial clearing after coughing Back - No focal tenderness Abd - Soft, non-tender Ext - No edema Neuro - Normal strength Skin - No rashes Psych - normal mood, and behavior  BMP Latest Ref Rng & Units 05/16/2016  Glucose 70 - 99 mg/dL 115(H)  BUN 6 - 23 mg/dL 13  Creatinine 0.40 - 1.20 mg/dL 0.60  Sodium 135 - 145 mEq/L 122(L)  Potassium 3.5 - 5.1 mEq/L 4.6  Chloride 96 - 112 mEq/L 85(L)  CO2 19 - 32 mEq/L 33(H)  Calcium 8.4 - 10.5 mg/dL 9.1    Discussion: She has persistent symptoms after recent exacerbation.  She had serratia in her  sputum.   Assessment/Plan:  Slow to resolve COPD exacerbation. - will give course of levaquin - will have her try symbicort  COPD with emphysema. - continue spiriva with prn proair  Right pleural effusion. - f/u CXR to monitor   Patient Instructions  Levaquin 750 mg one pill every other day  Symbicort two puffs twice per day >> rinse mouth after each use  Spiriva one puff daily  Follow up with Dr. Halford Chessman in 4 weeks   Chesley Mires, MD [place] Pager:  [phone] 07/02/2016, 2:54 PM

## 2016-07-02 NOTE — Patient Instructions (Signed)
Levaquin 750 mg one pill every other day  Symbicort two puffs twice per day >> rinse mouth after each use  Spiriva one puff daily  Follow up with Dr. Halford Chessman in 4 weeks

## 2016-07-03 ENCOUNTER — Telehealth: Payer: Self-pay | Admitting: Pulmonary Disease

## 2016-07-03 DIAGNOSIS — J90 Pleural effusion, not elsewhere classified: Secondary | ICD-10-CM

## 2016-07-03 NOTE — Telephone Encounter (Signed)
Dg Chest 2 View  Result Date: 07/02/2016 CLINICAL DATA:  Follow-up pleural effusion EXAM: CHEST  2 VIEW COMPARISON:  05/29/2016 FINDINGS: Cardiac shadow is mildly enlarged in size. The lungs are well aerated bilaterally with mild interstitial changes stable from the prior study. Small right-sided pleural effusion is again seen and stable in appearance. The bony structures are within normal limits. IMPRESSION: Stable right pleural effusion.  No new focal abnormality is seen. Electronically Signed   By: Inez Catalina M.D.   On: 07/02/2016 15:32    Still has Rt effusion.  Will arrange for IR to assess for thoracentesis and send for fluid analysis.

## 2016-07-08 ENCOUNTER — Ambulatory Visit (HOSPITAL_COMMUNITY)
Admission: RE | Admit: 2016-07-08 | Discharge: 2016-07-08 | Disposition: A | Discharge status: Home / Self Care | Payer: Medicare Other | Source: Ambulatory Visit | Attending: Pulmonary Disease | Admitting: Pulmonary Disease

## 2016-07-08 ENCOUNTER — Ambulatory Visit (HOSPITAL_COMMUNITY)
Admission: RE | Admit: 2016-07-08 | Discharge: 2016-07-08 | Disposition: A | Discharge status: Home / Self Care | Payer: Medicare Other | Source: Ambulatory Visit | Attending: Physician Assistant | Admitting: Physician Assistant

## 2016-07-08 DIAGNOSIS — J90 Pleural effusion, not elsewhere classified: Secondary | ICD-10-CM | POA: Insufficient documentation

## 2016-07-08 LAB — GRAM STAIN

## 2016-07-08 LAB — BODY FLUID CELL COUNT WITH DIFFERENTIAL
Eos, Fluid: 6 %
LYMPHS FL: 52 %
MONOCYTE-MACROPHAGE-SEROUS FLUID: 32 % — AB (ref 50–90)
Neutrophil Count, Fluid: 10 % (ref 0–25)
Total Nucleated Cell Count, Fluid: 2032 cu mm — ABNORMAL HIGH (ref 0–1000)

## 2016-07-08 LAB — GLUCOSE, SEROUS FLUID: GLUCOSE FL: 151 mg/dL

## 2016-07-08 LAB — PROTEIN, BODY FLUID: TOTAL PROTEIN, FLUID: 4 g/dL

## 2016-07-08 LAB — LACTATE DEHYDROGENASE, PLEURAL OR PERITONEAL FLUID: LD, Fluid: 159 U/L — ABNORMAL HIGH (ref 3–23)

## 2016-07-08 NOTE — Procedures (Signed)
Successful US guided right thoracentesis. Yielded 90 ml of clear yellow fluid. Pt tolerated procedure well. No immediate complications.  Specimen was sent for labs. CXR ordered.  WENDY S BLAIR PA-C 07/08/2016 10:20 AM

## 2016-07-10 ENCOUNTER — Ambulatory Visit (HOSPITAL_COMMUNITY): Payer: Medicare Other

## 2016-07-11 ENCOUNTER — Telehealth: Payer: Self-pay | Admitting: Pulmonary Disease

## 2016-07-11 NOTE — Telephone Encounter (Signed)
Rt thoracentesis 07/08/16 >> glucose 151, protein 4, LDH 159, WBC 2032 (52L, 38M, 10N, 6E), cytology with reactive mesothelial cells   Left message informing patient that pleural fluid showed inflammatory cells.  Advised her to call back with questions.  Otherwise complete Abx and f/u CXR at next visit.

## 2016-07-13 LAB — CULTURE, BODY FLUID W GRAM STAIN -BOTTLE: Culture: NO GROWTH

## 2016-08-12 ENCOUNTER — Ambulatory Visit (INDEPENDENT_AMBULATORY_CARE_PROVIDER_SITE_OTHER)
Admission: RE | Admit: 2016-08-12 | Discharge: 2016-08-12 | Disposition: A | Discharge status: Home / Self Care | Payer: Medicare Other | Source: Ambulatory Visit | Attending: Pulmonary Disease | Admitting: Pulmonary Disease

## 2016-08-12 ENCOUNTER — Ambulatory Visit (INDEPENDENT_AMBULATORY_CARE_PROVIDER_SITE_OTHER): Payer: Medicare Other | Admitting: Pulmonary Disease

## 2016-08-12 ENCOUNTER — Encounter: Payer: Self-pay | Admitting: Pulmonary Disease

## 2016-08-12 VITALS — BP 122/78 | HR 72 | Ht 63.75 in | Wt 229.0 lb

## 2016-08-12 DIAGNOSIS — J438 Other emphysema: Secondary | ICD-10-CM | POA: Diagnosis not present

## 2016-08-12 DIAGNOSIS — J90 Pleural effusion, not elsewhere classified: Secondary | ICD-10-CM

## 2016-08-12 NOTE — Progress Notes (Signed)
Current Outpatient Prescriptions on File Prior to Visit  Medication Sig  . acetaminophen (TYLENOL) 500 MG tablet Take by mouth as needed.    [name] Kitchen albuterol (PROAIR HFA) 108 (90 Base) MCG/ACT inhaler Inhale 2 puffs into the lungs every 6 (six) hours as needed.  [name] Kitchen atorvastatin (LIPITOR) 20 MG tablet Take 20 mg by mouth at bedtime.    [name] Kitchen b complex vitamins tablet Take 1 tablet by mouth daily.  . budesonide-formoterol (SYMBICORT) 160-4.5 MCG/ACT inhaler Inhale 2 puffs into the lungs 2 (two) times daily.  . carvedilol (COREG) 6.25 MG tablet Take 3.125 mg by mouth 2 (two) times daily with a meal.   . Cholecalciferol (VITAMIN D) 1000 UNITS capsule Take 2,000 Units by mouth.   . phenytoin (DILANTIN) 100 MG ER capsule Take 100 mg by mouth 3 (three) times daily.    [name] Kitchen telmisartan (MICARDIS) 40 MG tablet Take 40 mg by mouth daily.    [name] Kitchen tiotropium (SPIRIVA) 18 MCG inhalation capsule Place 1 capsule (18 mcg total) into inhaler and inhale daily.  . [DISCONTINUED] DEXILANT 60 MG capsule Take 60 mg by mouth daily as needed.    No current facility-administered medications on file prior to visit.     Chief Complaint  Patient presents with  . Follow-up    Pt states that she is still SOB and having some mucus production. Pt still taking Symbicort.     Pulmonary tests PFT 06/04/09 >> FEV1 1.80(87%), FVC 2.96(103%), FEV1% 61, TLC 5.29(110%), DLCO 66%, no BD Spirometry 09/21/13 >> FEV1 1.49 (70%), FEV1% 61 CT chest 05/29/16 >> small Rt effusion FeNO 07/02/16 >> 28 Rt thoracentesis 07/08/16 >> glucose 151, protein 4, LDH 159, WBC 2032 (52L, 71M, 10N, 6E), cytology with reactive mesothelial cells  Past medical history GERD, Seizure 1988, HLD, HTN  Past surgical history, Family history, Social history, Allergies reviewed  Vital signs BP 122/78 (BP Location: Left Arm, Cuff Size: Normal)   Pulse 72   Ht 5' 3.75" (1.619 m)   Wt 229 lb (103.9 kg)   SpO2 97%   BMI 39.62 kg/m   History of Present Illness: Meredith Marshall is a 75 y.o. female former smoker with COPD.  Her breathing is better.  She still has cough, but phlegm is now clear.  She denies chest pain, wheeze, fever, or hemoptysis.  She doesn't use symbicort every day, but her breathing is better when she uses symbicort.  She hasn't needed to use albuterol recently.  She denies leg swelling.  Physical Exam:  General - No distress ENT - No sinus tenderness, no oral exudate, no LAN Cardiac - s1s2 regular, no murmur Chest - faint crackles Rt base, no wheeze Back - No focal tenderness Abd - Soft, non-tender Ext - No edema Neuro - Normal strength Skin - No rashes Psych - normal mood, and behavior   Dg Chest 2 View  Result Date: 08/12/2016 CLINICAL DATA:  Followup right pleural effusion. EXAM: CHEST  2 VIEW COMPARISON:  07/08/2016 and multiple previous FINDINGS: Heart size is normal. Mediastinal shadows are normal except for aortic atherosclerosis. Small amount of fluid in the posterior costophrenic angle on the right, slightly diminished from the previous film. No pulmonary parenchymal abnormality. Partial eventration right hemidiaphragm. No bone abnormality. IMPRESSION: Very mild blunting of the right posterior costophrenic angle, slightly less than seen previously. Lungs otherwise clear. Electronically Signed   By: Meredith Marshall M.D.   On: 08/12/2016 13:17    Assessment/Plan:  COPD with emphysema. - continue  spiriva, symbicort with prn proair  Right pleural effusion. - slight improvement on xray - monitor clinically and f/u CXR at next ROV   Patient Instructions  Follow up in 3 months with chest xray   Meredith Mires, MD [place] [facility] Pager:  [phone] 08/12/2016, 12:05 PM

## 2016-08-12 NOTE — Patient Instructions (Signed)
Follow up in 3 months with chest xray

## 2016-09-02 ENCOUNTER — Encounter: Payer: Self-pay | Admitting: Internal Medicine

## 2016-09-02 ENCOUNTER — Ambulatory Visit (INDEPENDENT_AMBULATORY_CARE_PROVIDER_SITE_OTHER): Payer: Medicare Other | Admitting: Internal Medicine

## 2016-09-02 ENCOUNTER — Ambulatory Visit (INDEPENDENT_AMBULATORY_CARE_PROVIDER_SITE_OTHER)
Admission: RE | Admit: 2016-09-02 | Discharge: 2016-09-02 | Disposition: A | Discharge status: Home / Self Care | Payer: Medicare Other | Source: Ambulatory Visit | Attending: Internal Medicine | Admitting: Internal Medicine

## 2016-09-02 VITALS — BP 144/84 | HR 88 | Ht 64.0 in | Wt 227.0 lb

## 2016-09-02 DIAGNOSIS — J449 Chronic obstructive pulmonary disease, unspecified: Secondary | ICD-10-CM

## 2016-09-02 DIAGNOSIS — J209 Acute bronchitis, unspecified: Secondary | ICD-10-CM

## 2016-09-02 MED ORDER — PREDNISONE 10 MG PO TABS: ORAL_TABLET | ORAL | 0 refills | Status: DC

## 2016-09-02 MED ORDER — AMOXICILLIN-POT CLAVULANATE 875-125 MG PO TABS: 1.0000 | ORAL_TABLET | Freq: Two times a day (BID) | ORAL | 0 refills | Status: AC

## 2016-09-02 NOTE — Progress Notes (Signed)
  Pulmonary tests PFT 06/04/09 about the same time quite smoking >> FEV1 1.80  (87%), FVC 2.96(103%), FEV1% 61, TLC 5.29(110%), DLCO 66%, no BD Spirometry 09/21/13 >> FEV1 1.49 (70%), FEV1% 61 CT chest 05/29/16 >> small Rt effusion FeNO 07/02/16 >> 28 Rt thoracentesis 07/08/16 >> 90 cc   glucose 151, protein 4, LDH 159, WBC 2032 (52L, 56M, 10N, 6E), cytology with reactive mesothelial cells       09/08/2016 acute extended ov/Blinda Turek re: COPD GOLD I  Acute cough Chief Complaint  Patient presents with  . Acute Visit    congestion, cough, started 5 days ago, SOB  cough has been a chronic issue dry mostly until 5 d prior to [place] worse with green mucus then worse sob over baseline as well with nasal congestion but still comfortable p saba at rest and at baseline Not limited by breathing from desired activities  But quite inactive  No obvious day to day or daytime variability or assoc  mucus plugs or hemoptysis or cp or chest tightness, subjective wheeze or overt sinus or hb symptoms. No unusual exp hx or h/o childhood pna/ asthma or knowledge of premature birth.  Sleeping ok without nocturnal  or early am exacerbation  of respiratory  c/o's or need for noct saba. Also denies any obvious fluctuation of symptoms with weather or environmental changes or other aggravating or alleviating factors except as outlined above   Current Medications, Allergies, Complete Past Medical History, Past Surgical History, Family History, and Social History were reviewed in [employer] record.  ROS  The following are not active complaints unless bolded sore throat, dysphagia, dental problems, itching, sneezing,  nasal congestion or excess/ purulent secretions, ear ache,   fever, chills, sweats, unintended wt loss, classically pleuritic or exertional cp,  orthopnea pnd or leg swelling, presyncope, palpitations, abdominal pain, anorexia, nausea, vomiting, diarrhea  or change in bowel or bladder  habits, change in stools or urine, dysuria,hematuria,  rash, arthralgias, visual complaints, headache, numbness, weakness or ataxia or problems with walking or coordination,  change in mood/affect or memory.                     Physical Exam:  Very hoarse amb wf nad  Wt Readings from Last 3 Encounters:  08/12/16 229 lb (103.9 kg)  07/02/16 228 lb (103.4 kg)  05/16/16 230 lb (104.3 kg)    Vital signs reviewed - Note on arrival 02 sats  97% on RA       General - No distress ENT - No sinus tenderness, no oral exudate, no LAN/ top dentures  Cardiac - s1s2 regular, no murmur Chest - faint insp crackles R base / no def wheeze at all  Back - No focal tenderness Abd - Soft, non-tender Ext - No edema Neuro - Normal strength Skin - No rashes Psych - normal mood, and behavior    CXR PA and Lateral:   09/02/2016 :    I personally reviewed images and agree with radiology impression as follows:    1. Cardiomegaly.  No pulmonary venous congestion. 2. No acute pulmonary abnormality .   Assessment/Plan:

## 2016-09-02 NOTE — Patient Instructions (Addendum)
Stop spiriva   Ok to  Use symbicort as needed if start coughing/wheezing or needing your rescue inhaler  Work on inhaler technique:  relax and gently blow all the way out then take a nice smooth deep breath back in, triggering the inhaler at same time you start breathing in.  Hold for up to 5 seconds if you can. Blow out thru nose. Rinse and gargle with water when done     Augmentin 875 mg take one pill twice daily  X 10 days - take at breakfast and supper with large glass of water.  It would help reduce the usual side effects (diarrhea and yeast infections) if you ate cultured yogurt at lunch.   Prednisone 10 mg take  4 each am x 2 days,   2 each am x 2 days,  1 each am x 2 days and stop   Please remember to go to the x-ray department downstairs for your tests - we will call you with the results when they are available.  GERD (REFLUX)  is an extremely common cause of respiratory symptoms just like yours , many times with no obvious heartburn at all.    It can be treated with medication, but also with lifestyle changes including elevation of the head of your bed (ideally with 6 inch  bed blocks),  Smoking cessation, avoidance of late meals, excessive alcohol, and avoid fatty foods, chocolate, peppermint, colas, red wine, and acidic juices such as orange juice.  NO MINT OR MENTHOL PRODUCTS SO NO COUGH DROPS   USE SUGARLESS CANDY INSTEAD (Jolley ranchers or Stover's or Life Savers) or even ice chips will also do - the key is to swallow to prevent all throat clearing. NO OIL BASED VITAMINS - use powdered substitutes.  If cough continues >  Try prilosec otc 20mg   Take 30-60 min before first meal of the day and Pepcid ac (famotidine) 20 mg one @  bedtime until cough is completely gone for at least a week without the need for cough suppression        Keep previous appointment   With Dr Halford Chessman

## 2016-09-08 ENCOUNTER — Encounter: Payer: Self-pay | Admitting: Internal Medicine

## 2016-09-08 DIAGNOSIS — J449 Chronic obstructive pulmonary disease, unspecified: Secondary | ICD-10-CM | POA: Insufficient documentation

## 2016-09-08 NOTE — Assessment & Plan Note (Addendum)
Quit smoking 2010 PFT's  06/04/09   FEV1 1.89 (92 % ) ratio 63 with classic curvature   p 5  % improvement from saba p ? prior to study with DLCO  66 % corrects to 110 % for alv volume   - 09/02/2016  After extensive coaching HFA effectiveness =    75% > try off spiriva dpi due to dry chronic cough  .DDX of  difficult airways management almost all start with A and  include Adherence, Ace Inhibitors, Acid Reflux, Active Sinus Disease, Alpha 1 Antitripsin deficiency, Anxiety masquerading as Airways dz,  ABPA,  Allergy(esp in young), Aspiration (esp in elderly), Adverse effects of meds,  Active smokers, A bunch of PE's (a small clot burden can't cause this syndrome unless there is already severe underlying pulm or vascular dz with poor reserve) plus two Bs  = Bronchiectasis and Beta blocker use..and one C= CHF   Adherence is always the initial "prime suspect" and is a multilayered concern that requires a "trust but verify" approach in every patient - starting with knowing how to use medications, especially inhalers, correctly, keeping up with refills and understanding the fundamental difference between maintenance and prns vs those medications only taken for a very short course and then stopped and not refilled.  - hfa adequate so continue symb 160 2bid though if hoarseness/cough persist in excess of limiting sob probably ok to just use this "prn"  ? Adverse effects of dpi > dry chronic cough and hoarseness > consider restart as spriva respimat if not doing as well off lama  ? Active / acute sinus dz > augmentin/ f/u with sinus ct prn   ? Acid (or non-acid) GERD > always difficult to exclude as up to 75% of pts in some series report no assoc GI/ Heartburn symptoms> rec   diet restrictions then next consider add max (24h)  acid suppression   ? BB effect > probably ok with low dose coreg  ? chf > no evidence on cxr   I had an extended discussion with the patient reviewing all relevant studies completed  to date and  lasting 15 to 20 minutes of a 25 minute acute visit in pt not previously known to me   Each maintenance medication was reviewed in detail including most importantly the difference between maintenance and prns and under what circumstances the prns are to be triggered using an action plan format that is not reflected in the computer generated alphabetically organized AVS.    Please see AVS for unique instructions that I personally wrote and verbalized to the the pt in detail and then reviewed with pt  by my nurse highlighting any  changes in therapy recommended at today's visit to their plan of care.

## 2016-09-08 NOTE — Assessment & Plan Note (Signed)
Body mass index is 38.96 slt trend up   No results found for: TSH   Contributing to gerd tendency/ doe/reviewed the need and the process to achieve and maintain neg calorie balance > defer f/u primary care including intermittently monitoring thyroid status

## 2016-09-08 NOTE — Assessment & Plan Note (Signed)
Purulent tracheobronchitis by hx and exam but not pna/ assoc with nasal symptoms ? Sinusitis/bronchitis   rec augmentin x 10 days then return if not back to baseline

## 2016-11-17 ENCOUNTER — Encounter: Payer: Self-pay | Admitting: Pulmonary Disease

## 2016-11-17 ENCOUNTER — Ambulatory Visit (INDEPENDENT_AMBULATORY_CARE_PROVIDER_SITE_OTHER): Payer: Medicare Other | Admitting: Pulmonary Disease

## 2016-11-17 VITALS — BP 140/80 | HR 86 | Ht 64.0 in | Wt 227.0 lb

## 2016-11-17 DIAGNOSIS — J432 Centrilobular emphysema: Secondary | ICD-10-CM

## 2016-11-17 NOTE — Patient Instructions (Signed)
Follow up in 6 months 

## 2016-11-17 NOTE — Progress Notes (Signed)
Current Outpatient Prescriptions on File Prior to Visit  Medication Sig  . acetaminophen (TYLENOL) 500 MG tablet Take by mouth as needed.    [name] Kitchen albuterol (PROAIR HFA) 108 (90 Base) MCG/ACT inhaler Inhale 2 puffs into the lungs every 6 (six) hours as needed.  [name] Kitchen atorvastatin (LIPITOR) 20 MG tablet Take 20 mg by mouth at bedtime.    [name] Kitchen b complex vitamins tablet Take 1 tablet by mouth daily.  . budesonide-formoterol (SYMBICORT) 160-4.5 MCG/ACT inhaler Inhale 2 puffs into the lungs 2 (two) times daily.  . carvedilol (COREG) 6.25 MG tablet Take 3.125 mg by mouth 2 (two) times daily with a meal.   . Cholecalciferol (VITAMIN D) 1000 UNITS capsule Take 2,000 Units by mouth.   . phenytoin (DILANTIN) 100 MG ER capsule Take 100 mg by mouth 3 (three) times daily.    [name] Kitchen telmisartan (MICARDIS) 40 MG tablet Take 40 mg by mouth daily.    . predniSONE (DELTASONE) 10 MG tablet Take  4 each am x 2 days,   2 each am x 2 days,  1 each am x 2 days and stop (Patient not taking: Reported on 11/17/2016)  . [DISCONTINUED] DEXILANT 60 MG capsule Take 60 mg by mouth daily as needed.    No current facility-administered medications on file prior to visit.     Chief Complaint  Patient presents with  . Follow-up    Pt states she is doing well. Pt has SOB with activity, resting does help relieve it. Denies chest tightnes or cough.     Pulmonary tests PFT 06/04/09 >> FEV1 1.80(87%), FVC 2.96(103%), FEV1% 61, TLC 5.29(110%), DLCO 66%, no BD Spirometry 09/21/13 >> FEV1 1.49 (70%), FEV1% 61 CT chest 05/29/16 >> small Rt effusion FeNO 07/02/16 >> 28 Rt thoracentesis 07/08/16 >> glucose 151, protein 4, LDH 159, WBC 2032 (52L, 27M, 10N, 6E), cytology with reactive mesothelial cells  Past medical history GERD, Seizure 1988, HLD, HTN  Past surgical history, Family history, Social history, Allergies reviewed  Vital signs BP 140/80 (BP Location: Right Arm, Patient Position: Sitting, Cuff Size: Large)   Pulse 86   Ht 5\' 4"  (1.626  m)   Wt 227 lb (103 kg)   SpO2 94%   BMI 38.96 kg/m   History of Present Illness: Meredith Marshall is a 76 y.o. female former smoker with COPD.  She was seen in December by Dr. Melvyn Novas.  She had CXR then which showed clearing of effusion.  Her breathing has been doing better.  She is only using symbicort once or twice per week.  Not much cough.  Occasional wheeze and sputum.  Denies sinus congestion, sore throat, chest pain, swelling.  Physical Exam:  General - pleasant Eyes - wears glasses ENT - no sinus tenderness, no oral exudate, no LAn Cardiac - regular, no murmur Chest - no wheeze/rales/dullness Back - no tenderness Abd - soft, non tender Ext - no edema Neuro - normal strength Skin - no rashes Psych - normal mood  Assessment/Plan:  COPD with emphysema. - prn symbicort and albuterol    Patient Instructions  Follow up in 6 months   Chesley Mires, MD [facility] Pager:  [phone] 11/17/2016, 1:29 PM

## 2017-05-27 ENCOUNTER — Ambulatory Visit (INDEPENDENT_AMBULATORY_CARE_PROVIDER_SITE_OTHER): Payer: Medicare Other | Admitting: Pulmonary Disease

## 2017-05-27 ENCOUNTER — Encounter: Payer: Self-pay | Admitting: Pulmonary Disease

## 2017-05-27 VITALS — BP 138/78 | HR 79 | Ht 63.0 in | Wt 230.6 lb

## 2017-05-27 DIAGNOSIS — J432 Centrilobular emphysema: Secondary | ICD-10-CM

## 2017-05-27 MED ORDER — BUDESONIDE-FORMOTEROL FUMARATE 160-4.5 MCG/ACT IN AERO: 2.0000 | INHALATION_SPRAY | Freq: Two times a day (BID) | RESPIRATORY_TRACT | 0 refills | Status: DC

## 2017-05-27 NOTE — Progress Notes (Signed)
Current Outpatient Prescriptions on File Prior to Visit  Medication Sig  . acetaminophen (TYLENOL) 500 MG tablet Take by mouth as needed.    [name] Kitchen albuterol (PROAIR HFA) 108 (90 Base) MCG/ACT inhaler Inhale 2 puffs into the lungs every 6 (six) hours as needed.  [name] Kitchen atorvastatin (LIPITOR) 20 MG tablet Take 20 mg by mouth at bedtime.    [name] Kitchen b complex vitamins tablet Take 1 tablet by mouth daily.  . budesonide-formoterol (SYMBICORT) 160-4.5 MCG/ACT inhaler Inhale 2 puffs into the lungs 2 (two) times daily.  . carvedilol (COREG) 6.25 MG tablet Take 3.125 mg by mouth 2 (two) times daily with a meal.   . Cholecalciferol (VITAMIN D) 1000 UNITS capsule Take 2,000 Units by mouth.   . phenytoin (DILANTIN) 100 MG ER capsule Take 100 mg by mouth 3 (three) times daily.    [name] Kitchen telmisartan (MICARDIS) 40 MG tablet Take 40 mg by mouth daily.    . [DISCONTINUED] DEXILANT 60 MG capsule Take 60 mg by mouth daily as needed.    No current facility-administered medications on file prior to visit.     Chief Complaint  Patient presents with  . Follow-up    Pt has SOB with extersion, overall doing well.     Pulmonary tests PFT 06/04/09 >> FEV1 1.80(87%), FVC 2.96(103%), FEV1% 61, TLC 5.29(110%), DLCO 66%, no BD Spirometry 09/21/13 >> FEV1 1.49 (70%), FEV1% 61 CT chest 05/29/16 >> small Rt effusion FeNO 07/02/16 >> 28 Rt thoracentesis 07/08/16 >> glucose 151, protein 4, LDH 159, WBC 2032 (52L, 75M, 10N, 6E), cytology with reactive mesothelial cells  Past medical history GERD, Seizure 1988, HLD, HTN  Past surgical history, Family history, Social history, Allergies reviewed  Vital signs BP 138/78 (BP Location: Left Arm, Cuff Size: Normal)   Pulse 79   Ht 5\' 3"  (1.6 m)   Wt 230 lb 9.6 oz (104.6 kg)   SpO2 95%   BMI 40.85 kg/m   History of Present Illness: Meredith Marshall is a 76 y.o. female former smoker with COPD.  She hasn't needed to use symbicort much.  She is not having cough, wheeze, or sputum.  Her leg  swelling is better.    Physical Exam:  General - pleasant Eyes - pupils reactive ENT - no sinus tenderness, no oral exudate, no LAN Cardiac - regular, no murmur Chest - no wheeze, rales Abd - soft, non tender Ext - no edema Skin - no rashes Neuro - normal strength Psych - normal mood   Assessment/Plan:  COPD with emphysema. - prn symbicort and/or albuterol   Patient Instructions  Follow up in 6 months   Chesley Mires, MD [facility] Pager:  [phone] 05/27/2017, 2:23 PM

## 2017-05-27 NOTE — Patient Instructions (Signed)
Follow up in 6 months 

## 2017-05-27 NOTE — Addendum Note (Signed)
Addended by: Georjean Mode on: 05/27/2017 02:41 PM   Modules accepted: Orders

## 2017-09-03 IMAGING — MR MR HEAD WO/W CM
10 of 11 series · 32 of 48 positions shown · IV contrast (multihance)
Comparison: None.

CLINICAL DATA: Sudden hearing loss on the right with ringing, 8-10
weeks ago.

Creatinine was obtained on site at [HOSPITAL] at [HOSPITAL].
Results: Creatinine 0.5 mg/dL.
EXAM:
MRI HEAD WITHOUT AND WITH CONTRAST
TECHNIQUE: Multiplanar, multiecho pulse sequences of the brain and surrounding
structures were obtained without and with intravenous contrast.
CONTRAST:  20mL MULTIHANCE GADOBENATE DIMEGLUMINE 529 MG/ML IV SOLN

[Series 2: T1 · sagittal · 5.0mm · 0.45mm/px · 4 of 21 slices shown (1 of 3)]
[im 1/21]
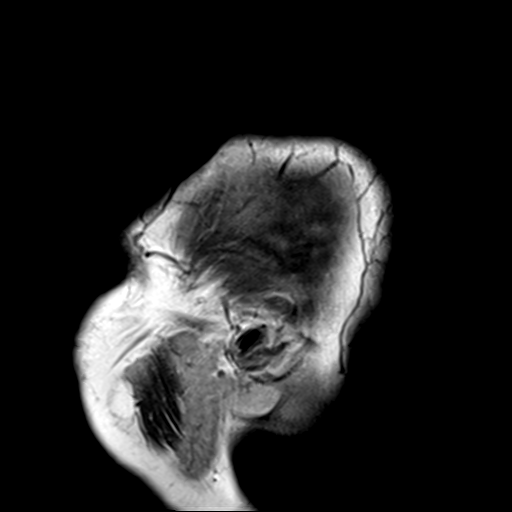
[im 7/21]
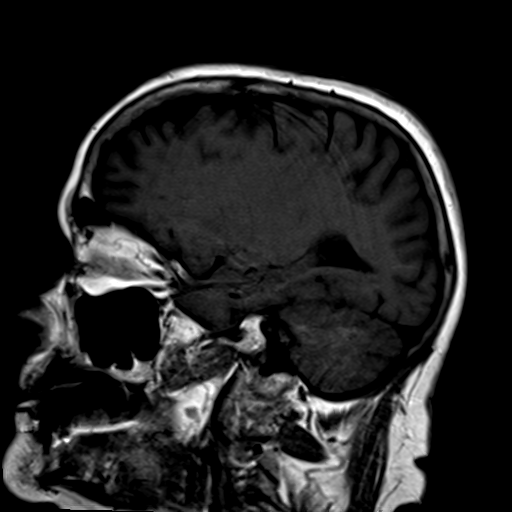
[im 14/21]
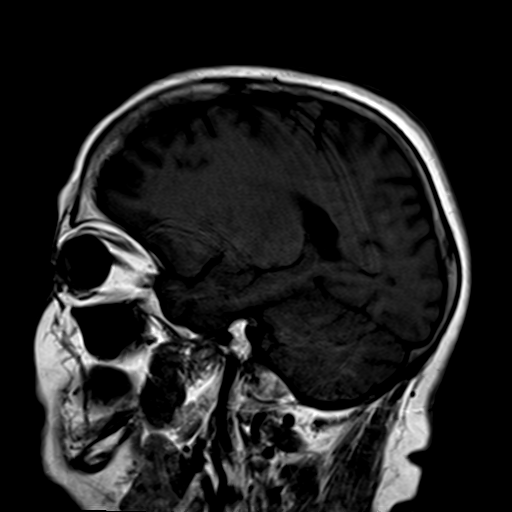
[im 21/21]
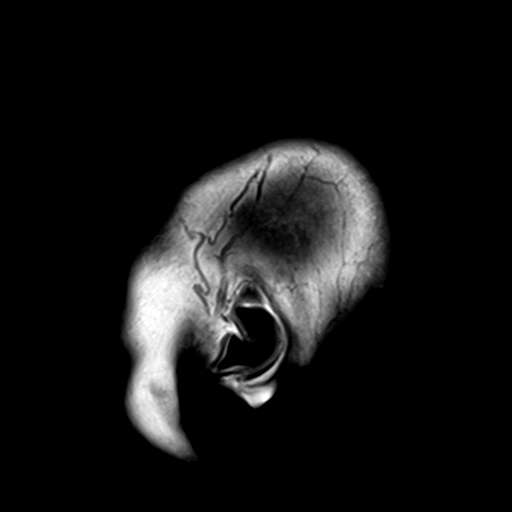

[Series 3: DWI · axial · 3.0mm · 1.80mm/px · z∈[-66,+79]mm · 8 of 100 slices shown (1 of 2)]
[im 1/100]
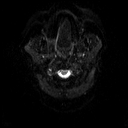
[im 16/100]
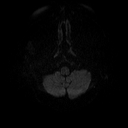
[im 31/100]
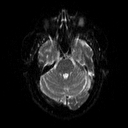
[im 46/100]
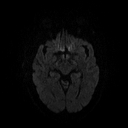
[im 54/100]
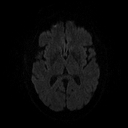
[im 69/100]
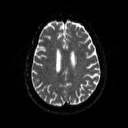
[im 84/100]
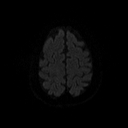
[im 100/100]
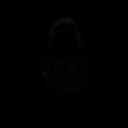

[Series 4: DWI · axial · 3.0mm · 1.80mm/px · z∈[-66,+79]mm · 6 of 48 slices shown (2 of 2)]
[im 1/48]
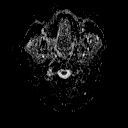
[im 10/48]
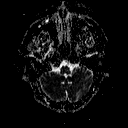
[im 19/48]
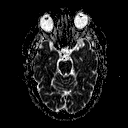
[im 29/48]
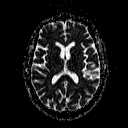
[im 38/48]
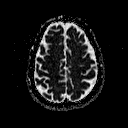
[im 48/48]
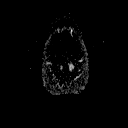

[Series 5: T2 · axial · 5.0mm · 0.45mm/px · z∈[-64,+77]mm · 3 of 23 slices shown]
[im 1/23]
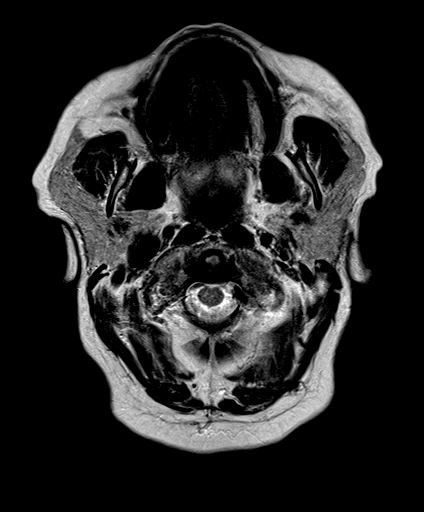
[im 12/23]
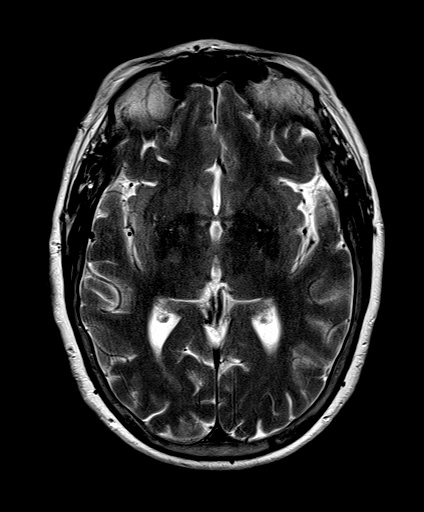
[im 23/23]
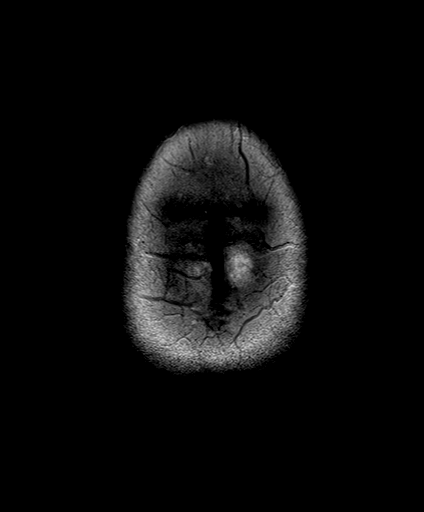

[Series 6: FLAIR · axial · 5.0mm · 0.45mm/px · z∈[-65,+77]mm · 3 of 23 slices shown]
[im 1/23]
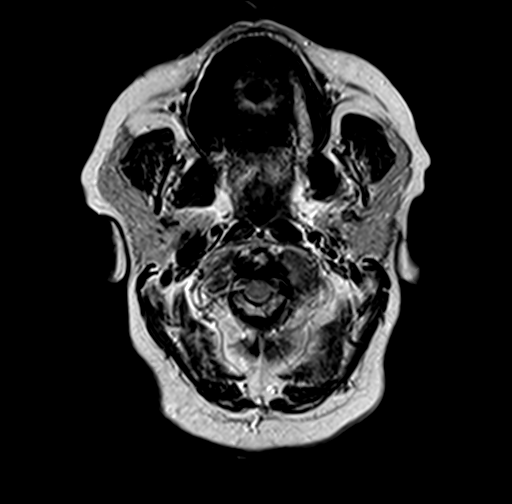
[im 12/23]
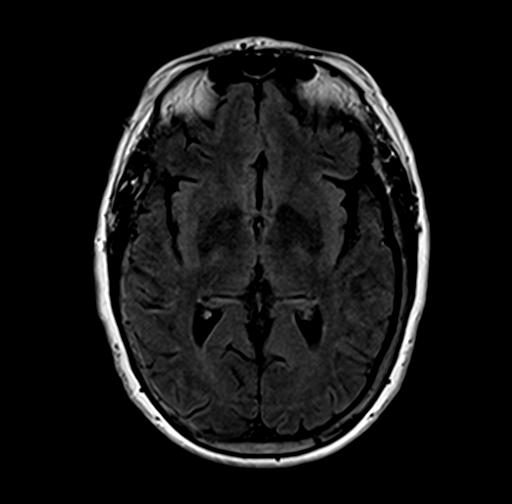
[im 23/23]
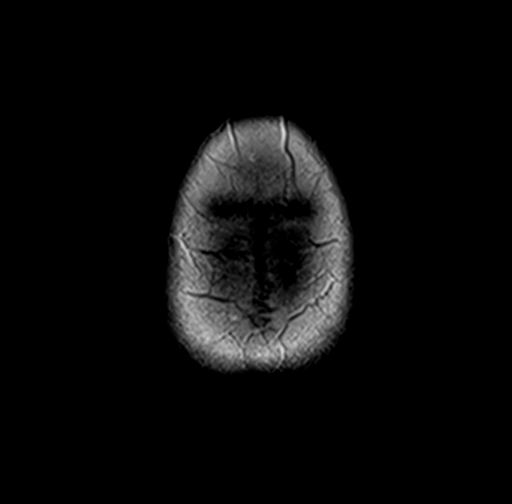

[Series 7: T1 · coronal · 3.0mm · 0.35mm/px · 1 of 11 slices shown (2 of 3)]
[im 1/11]
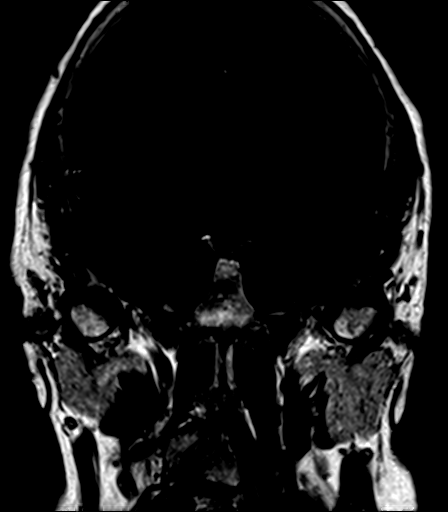

[Series 8: T1 · axial · 3.0mm · 0.35mm/px · 1 of 11 slices shown (3 of 3)]
[im 1/11]
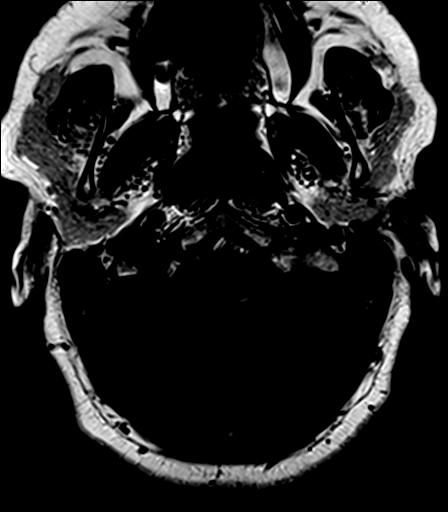

[Series 9: bSSFP · axial · 1.0mm · 0.28mm/px · z∈[-64,-38]mm · 4 of 36 slices shown]
[im 1/36]
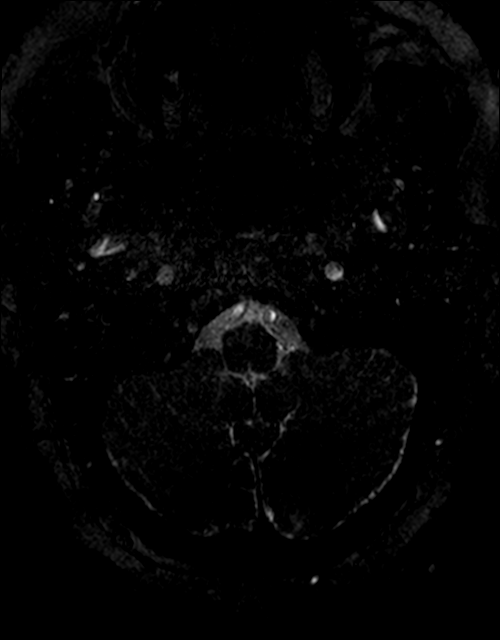
[im 9/36]
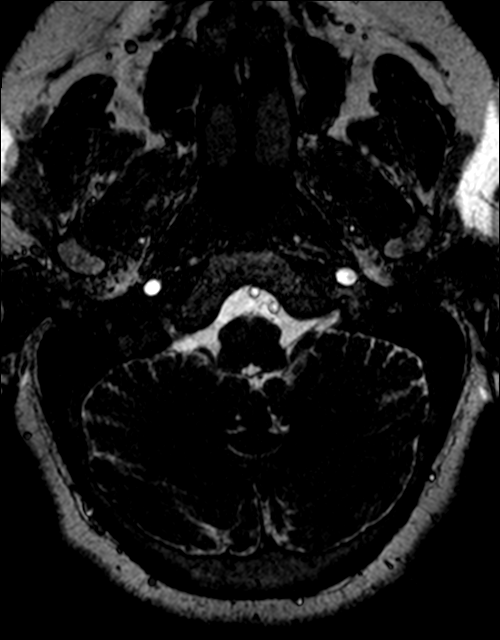
[im 18/36]
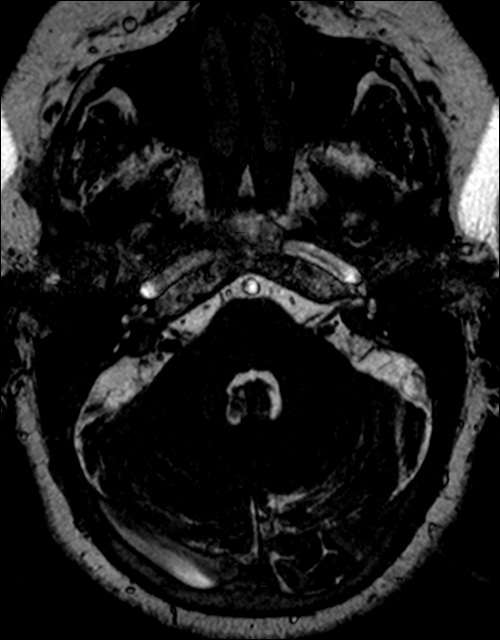
[im 27/36]
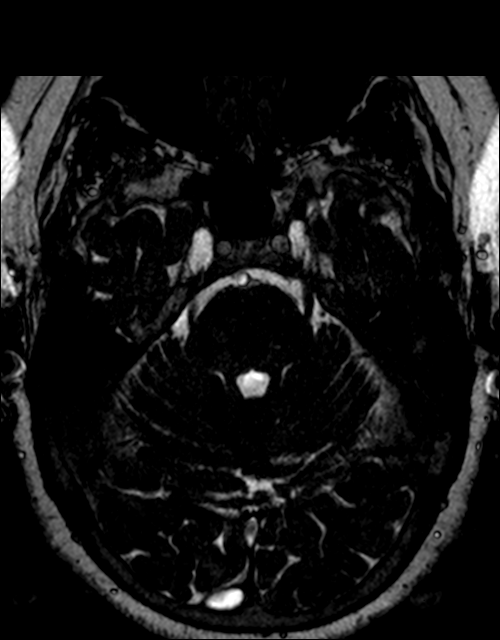

[Series 10: T1 post-contrast · coronal · 3.0mm · 0.35mm/px · 1 of 11 slices shown (1 of 2)]
[im 1/11]
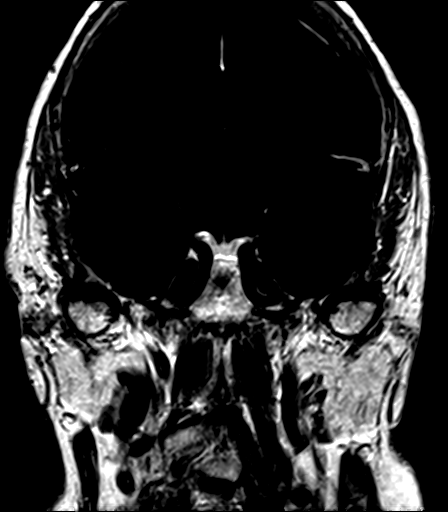

[Series 11: T1 post-contrast · axial · 3.0mm · 0.35mm/px · 1 of 11 slices shown (2 of 2)]
[im 1/11]
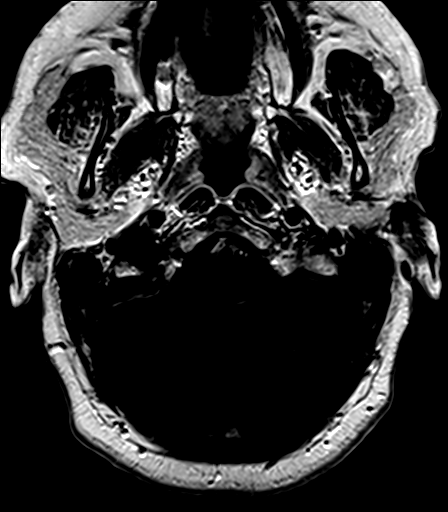

[32 of 48 positions shown; findings below may reference images not displayed]

FINDINGS: Diffusion imaging does not show any acute or subacute infarction.
The brainstem, cerebellum and cerebral hemispheres are normal
without evidence of advanced atrophy, old small or large vessel
insult, mass lesion, hemorrhage, hydrocephalus or extra-axial
collection.

CP angle regions are normal. Seventh and eighth nerve complexes are
normal bilaterally. No vestibular schwannoma. No abnormal vascular
structure. No fluid in the sinuses, middle ears or mastoids. Major
vessels at the base of the brain show flow.
IMPRESSION: Normal examination. No cause of right-sided hearing loss identified.
No vestibular schwannoma.

## 2017-09-11 ENCOUNTER — Encounter: Payer: Self-pay | Admitting: Gastroenterology

## 2017-12-28 ENCOUNTER — Encounter: Payer: Self-pay | Admitting: Pulmonary Disease

## 2017-12-28 ENCOUNTER — Ambulatory Visit: Payer: Medicare Other | Admitting: Pulmonary Disease

## 2017-12-28 VITALS — BP 150/80 | HR 88 | Ht 63.0 in | Wt 226.0 lb

## 2017-12-28 DIAGNOSIS — J432 Centrilobular emphysema: Secondary | ICD-10-CM

## 2017-12-28 NOTE — Progress Notes (Signed)
[facility]  Chief Complaint  Patient presents with  . Follow-up    ROV    Vital signs: BP (!) 150/80 (BP Location: Right Arm, Cuff Size: Normal)   Pulse 88   Ht 5\' 3"  (1.6 m)   Wt 226 lb (102.5 kg)   SpO2 97%   BMI 40.03 kg/m   History of Present Illness: Meredith Marshall is a 77 y.o. female former smoker with COPD.  She is very worried about what is going on with her husband.  She isn't having cough, wheeze, sputum, chest pain, fever, hemoptysis, or swelling.  Keeps up with activities for the most part.  Uses symbicort and albuterol intermittently.  Physical Exam:  General - pleasant Eyes - pupils reactive ENT - no sinus tenderness, no oral exudate, no LAN Cardiac - regular, no murmur Chest - no wheeze, rales Abd - soft, non tender Ext - no edema Skin - no rashes Neuro - normal strength Psych - normal mood  Assessment/Plan:  COPD with emphysema and chronic bronchitis. - continue symbicort, prn albuterol   Patient Instructions  Follow up in 6 months    Chesley Mires, MD [place] 12/28/2017, 12:04 PM  Flow Sheet  Pulmonary tests: PFT 06/04/09 >> FEV1 1.80(87%), FVC 2.96(103%), FEV1% 61, TLC 5.29(110%), DLCO 66%, no BD Spirometry 09/21/13 >> FEV1 1.49 (70%), FEV1% 61 CT chest 05/29/16 >> small Rt effusion FeNO 07/02/16 >> 28 Rt thoracentesis 07/08/16 >> glucose 151, protein 4, LDH 159, WBC 2032 (52L, 44M, 10N, 6E), cytology with reactive mesothelial cells  Past Medical History: She  has a past medical history of Barrett esophagus, Cervical spine disease, Chronic cystic mastitis (1971), COPD (chronic obstructive pulmonary disease) ([place]), Facial basal cell cancer (2015), GERD (gastroesophageal reflux disease), Hemorrhoids, Hyperlipidemia, Hypertension, Osteoporosis, Seizure ([place]), Thyroiditis (1989), and Tobacco abuse.  Past Surgical History: She  has a past surgical history that includes  Tonsillectomy (1956); Cesarean section (1960, 1963, 1967); Breast biopsy (1972); and trigger thumb (Bilateral, 2005, 2007).  Family History: Her family history includes Cancer in her father; Heart disease in her sister; Lung cancer in her mother.  Social History: She  reports that she quit smoking about 9 years ago. Her smoking use included cigarettes. She has a 35.00 pack-year smoking history. She has never used smokeless tobacco. She reports that she does not drink alcohol or use drugs.  Medications: Allergies as of 12/28/2017      Reactions   Carbamazepine    REACTION: hives, itching   Phenobarbital    REACTION: hives, itching   Phenytoin    REACTION: generic - hives/itching.  Pt reports she unable to tolerate the GENERIC but does well with the [facility] name   Ramipril    REACTION: hives, itching   Sulfonamide Derivatives    REACTION: hives, itching      Medication List        Accurate as of 12/28/17 12:04 PM. Always use your most recent med list.          acetaminophen 500 MG tablet Commonly known as:  TYLENOL Take by mouth as needed.   albuterol 108 (90 Base) MCG/ACT inhaler Commonly known as:  PROAIR HFA Inhale 2 puffs into the lungs every 6 (six) hours as needed.   atorvastatin 20 MG tablet Commonly known as:  LIPITOR Take 20 mg by mouth at bedtime.   b complex vitamins tablet Take 1 tablet by mouth daily.   budesonide-formoterol 160-4.5 MCG/ACT inhaler Commonly  known as:  SYMBICORT Inhale 2 puffs into the lungs 2 (two) times daily.   carvedilol 6.25 MG tablet Commonly known as:  COREG Take 3.125 mg by mouth 2 (two) times daily with a meal.   phenytoin 100 MG ER capsule Commonly known as:  DILANTIN Take 100 mg by mouth 3 (three) times daily.   telmisartan 40 MG tablet Commonly known as:  MICARDIS Take 40 mg by mouth daily.   Vitamin D 1000 units capsule Take 2,000 Units by mouth.

## 2017-12-28 NOTE — Patient Instructions (Signed)
Follow up in 6 months 

## 2018-01-08 ENCOUNTER — Other Ambulatory Visit: Payer: Self-pay | Admitting: Internal Medicine

## 2018-01-08 DIAGNOSIS — R911 Solitary pulmonary nodule: Secondary | ICD-10-CM

## 2018-02-05 ENCOUNTER — Ambulatory Visit
Admission: RE | Admit: 2018-02-05 | Discharge: 2018-02-05 | Disposition: A | Discharge status: Home / Self Care | Payer: Medicare Other | Source: Ambulatory Visit | Attending: Internal Medicine | Admitting: Internal Medicine

## 2018-02-05 DIAGNOSIS — R911 Solitary pulmonary nodule: Secondary | ICD-10-CM

## 2018-04-27 ENCOUNTER — Encounter: Payer: Self-pay | Admitting: Gastroenterology

## 2018-06-15 ENCOUNTER — Encounter: Payer: Self-pay | Admitting: Gastroenterology

## 2018-06-15 ENCOUNTER — Ambulatory Visit (AMBULATORY_SURGERY_CENTER): Payer: Self-pay | Admitting: *Deleted

## 2018-06-15 VITALS — Ht 63.75 in | Wt 222.8 lb

## 2018-06-15 DIAGNOSIS — Z86010 Personal history of colonic polyps: Secondary | ICD-10-CM

## 2018-06-15 MED ORDER — PEG 3350-KCL-NA BICARB-NACL 420 G PO SOLR: 4000.0000 mL | Freq: Once | ORAL | 0 refills | Status: AC

## 2018-06-15 NOTE — Progress Notes (Signed)
No egg or soy allergy known to patient  No issues with past sedation with any surgeries  or procedures, no intubation problems  No diet pills per patient No home 02 use per patient  No blood thinners per patient  Pt denies issues with constipation  No A fib or A flutter  EMMI video sent to pt's e mail pt declined   

## 2018-06-29 ENCOUNTER — Encounter: Payer: Self-pay | Admitting: Gastroenterology

## 2018-06-29 ENCOUNTER — Ambulatory Visit (AMBULATORY_SURGERY_CENTER): Payer: Medicare Other | Admitting: Gastroenterology

## 2018-06-29 VITALS — BP 140/66 | HR 78 | Temp 98.6°F | Resp 23 | Ht 63.0 in | Wt 222.0 lb

## 2018-06-29 DIAGNOSIS — K621 Rectal polyp: Secondary | ICD-10-CM

## 2018-06-29 DIAGNOSIS — Z86010 Personal history of colonic polyps: Secondary | ICD-10-CM

## 2018-06-29 DIAGNOSIS — K635 Polyp of colon: Secondary | ICD-10-CM

## 2018-06-29 DIAGNOSIS — D128 Benign neoplasm of rectum: Secondary | ICD-10-CM

## 2018-06-29 DIAGNOSIS — D129 Benign neoplasm of anus and anal canal: Secondary | ICD-10-CM

## 2018-06-29 MED ORDER — SODIUM CHLORIDE 0.9 % IV SOLN: 500.0000 mL | Freq: Once | INTRAVENOUS | Status: DC

## 2018-06-29 NOTE — Progress Notes (Signed)
Called to room to assist during endoscopic procedure.  Patient ID and intended procedure confirmed with present staff. Received instructions for my participation in the procedure from the performing physician.  

## 2018-06-29 NOTE — Op Note (Signed)
[facility] Patient Name: Meredith Marshall Procedure Date: 06/29/2018 9:24 AM MRN: [id number] Endoscopist: Milus Banister , MD Age: 77 Referring MD:  Date of Birth: 03/27/1941 Gender: Female Account #: [id number] Procedure:                Colonoscopy Indications:              High risk colon cancer surveillance: Personal                            history of colonic polyps; Colonoscopy 2015 months                            ago, 3cm polyp removed from hepatic flexure using                            piecemeal technique. The site was labled with Spot                            following resection. Followup colonoscopy 6 (2015)                            months later showed no residual polyp mucosa at hep                            flexure, a separate subCM adenom was removed. Medicines:                Monitored Anesthesia Care Procedure:                Pre-Anesthesia Assessment:                           - Prior to the procedure, a History and Physical                            was performed, and patient medications and                            allergies were reviewed. The patient's tolerance of                            previous anesthesia was also reviewed. The risks                            and benefits of the procedure and the sedation                            options and risks were discussed with the patient.                            All questions were answered, and informed consent                            was obtained. Prior Anticoagulants: The patient has  taken no previous anticoagulant or antiplatelet                            agents. ASA Grade Assessment: II - A patient with                            mild systemic disease. After reviewing the risks                            and benefits, the patient was deemed in                            satisfactory condition to undergo the procedure.                           After  obtaining informed consent, the colonoscope                            was passed under direct vision. Throughout the                            procedure, the patient's blood pressure, pulse, and                            oxygen saturations were monitored continuously. The                            Colonoscope was introduced through the anus and                            advanced to the the cecum, identified by                            appendiceal orifice and ileocecal valve. The                            colonoscopy was performed without difficulty. The                            patient tolerated the procedure well. The quality                            of the bowel preparation was good. The ileocecal                            valve, appendiceal orifice, and rectum were                            photographed. Scope In: 9:28:21 AM Scope Out: 9:44:32 AM Scope Withdrawal Time: 0 hours 10 minutes 59 seconds  Total Procedure Duration: 0 hours 16 minutes 11 seconds  Findings:                 The previous 2015 SPOT tattoo site at the hepatic  flexure was noted and it was clear of residual or                            recurrent adenomatous appearing mucosa.                           A 2 mm polyp was found in the rectum. The polyp was                            sessile. The polyp was removed with a cold snare.                            Resection and retrieval were complete.                           The exam was otherwise without abnormality on                            direct and retroflexion views. Complications:            No immediate complications. Estimated blood loss:                            None. Estimated Blood Loss:     Estimated blood loss: none. Impression:               - One 2 mm polyp in the rectum, removed with a cold                            snare. Resected and retrieved.                           - The examination was otherwise  normal on direct                            and retroflexion views. Recommendation:           - Patient has a contact number available for                            emergencies. The signs and symptoms of potential                            delayed complications were discussed with the                            patient. Return to normal activities tomorrow.                            Written discharge instructions were provided to the                            patient.                           - Resume  previous diet.                           - Continue present medications.                           - Await pathology results. Milus Banister, MD 06/29/2018 9:53:21 AM This report has been signed electronically.

## 2018-06-29 NOTE — Progress Notes (Signed)
Pt's states no medical or surgical changes since previsit or office visit. 

## 2018-06-29 NOTE — Patient Instructions (Signed)
Continue present medications. Please read handout provided on Polyps.    YOU HAD AN ENDOSCOPIC PROCEDURE TODAY AT [facility]:   Refer to the procedure report that was given to you for any specific questions about what was found during the examination.  If the procedure report does not answer your questions, please call your gastroenterologist to clarify.  If you requested that your care partner not be given the details of your procedure findings, then the procedure report has been included in a sealed envelope for you to review at your convenience later.  YOU SHOULD EXPECT: Some feelings of bloating in the abdomen. Passage of more gas than usual.  Walking can help get rid of the air that was put into your GI tract during the procedure and reduce the bloating. If you had a lower endoscopy (such as a colonoscopy or flexible sigmoidoscopy) you may notice spotting of blood in your stool or on the toilet paper. If you underwent a bowel prep for your procedure, you may not have a normal bowel movement for a few days.  Please Note:  You might notice some irritation and congestion in your nose or some drainage.  This is from the oxygen used during your procedure.  There is no need for concern and it should clear up in a day or so.  SYMPTOMS TO REPORT IMMEDIATELY:   Following lower endoscopy (colonoscopy or flexible sigmoidoscopy):  Excessive amounts of blood in the stool  Significant tenderness or worsening of abdominal pains  Swelling of the abdomen that is new, acute  Fever of 100F or higher    For urgent or emergent issues, a gastroenterologist can be reached at any hour by calling [phone].   DIET:  We do recommend a small meal at first, but then you may proceed to your regular diet.  Drink plenty of fluids but you should avoid alcoholic beverages for 24 hours.  ACTIVITY:  You should plan to take it easy for the rest of today and you should NOT DRIVE or use heavy  machinery until tomorrow (because of the sedation medicines used during the test).    FOLLOW UP: Our staff will call the number listed on your records the next business day following your procedure to check on you and address any questions or concerns that you may have regarding the information given to you following your procedure. If we do not reach you, we will leave a message.  However, if you are feeling well and you are not experiencing any problems, there is no need to return our call.  We will assume that you have returned to your regular daily activities without incident.  If any biopsies were taken you will be contacted by phone or by letter within the next 1-3 weeks.  Please call us at [phone] if you have not heard about the biopsies in 3 weeks.    SIGNATURES/CONFIDENTIALITY: You and/or your care partner have signed paperwork which will be entered into your electronic medical record.  These signatures attest to the fact that that the information above on your After Visit Summary has been reviewed and is understood.  Full responsibility of the confidentiality of this discharge information lies with you and/or your care-partner.

## 2018-06-29 NOTE — Progress Notes (Signed)
Spontaneous respirations throughout. VSS. Resting comfortably. To PACU on room air. Report to  RN. 

## 2018-06-30 ENCOUNTER — Encounter: Payer: Self-pay | Admitting: Pulmonary Disease

## 2018-06-30 ENCOUNTER — Ambulatory Visit: Payer: Medicare Other | Admitting: Pulmonary Disease

## 2018-06-30 ENCOUNTER — Telehealth: Payer: Self-pay

## 2018-06-30 VITALS — BP 130/84 | HR 87 | Ht 63.0 in | Wt 223.0 lb

## 2018-06-30 DIAGNOSIS — J432 Centrilobular emphysema: Secondary | ICD-10-CM | POA: Diagnosis not present

## 2018-06-30 NOTE — Progress Notes (Signed)
[facility]  Chief Complaint  Patient presents with  . Follow-up    Pt is doing well overall since last ov.    Constitutional:  BP 130/84 (BP Location: Left Arm, Cuff Size: Normal)   Pulse 87   Ht 5\' 3"  (1.6 m)   Wt 223 lb (101.2 kg)   SpO2 96%   BMI 39.50 kg/m   Past Medical History:  Barrett's esophagus, Cataract, HLD, HTN, Osteoporosis  Brief Summary:  Meredith Marshall is a 77 y.o. female former smoker with COPD.  She is slowly adjusting to life since her husband passed.  She got her flu shot already.  She has cough and wheeze episodes once per week.  Uses proair or symbicort sporadically.  Keeps up with activities.  Not having sputum, chest pain, hemoptysis, or swelling.    Physical Exam:   Appearance - well kempt  ENMT - clear nasal mucosa, midline nasal septum, no oral exudates, no LAN, trachea midline Respiratory - normal chest wall, normal respiratory effort, no accessory muscle use, no wheeze/rales CV - s1s2 regular rate and rhythm, no murmurs, no peripheral edema, radial pulses symmetric GI - soft, non tender, no masses Lymph - no adenopathy noted in neck and axillary areas MSK - normal muscle strength and tone, normal gait Ext - no cyanosis, clubbing, or joint inflammation noted Skin - no rashes, lesions, or ulcers Neuro - normal strength Psych - normal mood and affect   Assessment/Plan:   COPD. - she seems to be doing okay with prn albuterol and/or symbicort   Patient Instructions  Follow up in 1 year   Chesley Mires, MD [place] Pager: [phone] 06/30/2018, 4:08 PM  Flow Sheet     Pulmonary tests:  PFT 06/04/09 >> FEV1 1.80(87%), FVC 2.96(103%), FEV1% 61, TLC 5.29(110%), DLCO 66%, no BD Spirometry 09/21/13 >> FEV1 1.49 (70%), FEV1% 61 CT chest 05/29/16 >> small Rt effusion FeNO 07/02/16 >> 28 Rt thoracentesis 07/08/16 >> glucose 151, protein 4, LDH 159, WBC 2032 (52L, 20M,  10N, 6E), cytology with reactive mesothelial cells   Medications:   Allergies as of 06/30/2018      Reactions   Carbamazepine    REACTION: hives, itching   Phenobarbital    REACTION: hives, itching   Phenytoin    REACTION: generic - hives/itching.  Pt reports she unable to tolerate the GENERIC but does well with the [facility] name   Ramipril    REACTION: hives, itching   Sulfonamide Derivatives    REACTION: hives, itching      Medication List        Accurate as of 06/30/18  4:08 PM. Always use your most recent med list.          acetaminophen 500 MG tablet Commonly known as:  TYLENOL Take by mouth as needed.   albuterol 108 (90 Base) MCG/ACT inhaler Commonly known as:  PROVENTIL HFA;VENTOLIN HFA Inhale 2 puffs into the lungs every 6 (six) hours as needed.   atorvastatin 20 MG tablet Commonly known as:  LIPITOR Take 20 mg by mouth at bedtime.   b complex vitamins tablet Take 1 tablet by mouth daily.   carvedilol 6.25 MG tablet Commonly known as:  COREG Take 3.125 mg by mouth 2 (two) times daily with a meal.   phenytoin 100 MG ER capsule Commonly known as:  DILANTIN Take 100 mg by mouth 3 (three) times daily.   telmisartan 40 MG tablet Commonly known as:  MICARDIS Take 40 mg by mouth daily.   Vitamin D 1000 units capsule Take 2,000 Units by mouth.       Past Surgical History:  She  has a past surgical history that includes Tonsillectomy (1956); Cesarean section (1960, 1963, 1967); Breast biopsy (1972); trigger thumb (Bilateral, 2005, 2007); Colonoscopy; Polypectomy; and Knee arthroscopy.  Family History:  Her family history includes Cancer in her father; Esophageal cancer in her father; Heart disease in her sister; Lung cancer in her mother.  Social History:  She  reports that she quit smoking about 9 years ago. Her smoking use included cigarettes. She has a 35.00 pack-year smoking history. She has never used smokeless tobacco. She reports that she does  not drink alcohol or use drugs.

## 2018-06-30 NOTE — Telephone Encounter (Signed)
  Follow up Call-  Call back number 06/29/2018  Post procedure Call Back phone  # [phone]  Permission to leave phone message Yes  Some recent data might be hidden     Patient questions:  Do you have a fever, pain , or abdominal swelling? No. Pain Score  0 *  Have you tolerated food without any problems? Yes.    Have you been able to return to your normal activities? Yes.    Do you have any questions about your discharge instructions: Diet   No. Medications  No. Follow up visit  No.  Do you have questions or concerns about your Care? No.  Actions: * If pain score is 4 or above: No action needed, pain <4.

## 2018-06-30 NOTE — Patient Instructions (Signed)
Follow up in 1 year.

## 2018-07-09 ENCOUNTER — Encounter: Payer: Self-pay | Admitting: Gastroenterology

## 2018-09-06 ENCOUNTER — Encounter: Payer: Self-pay | Admitting: Pulmonary Disease

## 2018-09-06 ENCOUNTER — Ambulatory Visit: Payer: Medicare Other | Admitting: Pulmonary Disease

## 2018-09-06 VITALS — BP 140/82 | HR 84 | Temp 98.7°F | Ht 63.0 in | Wt 221.0 lb

## 2018-09-06 DIAGNOSIS — J449 Chronic obstructive pulmonary disease, unspecified: Secondary | ICD-10-CM

## 2018-09-06 DIAGNOSIS — J209 Acute bronchitis, unspecified: Secondary | ICD-10-CM | POA: Diagnosis not present

## 2018-09-06 DIAGNOSIS — J441 Chronic obstructive pulmonary disease with (acute) exacerbation: Secondary | ICD-10-CM

## 2018-09-06 DIAGNOSIS — J432 Centrilobular emphysema: Secondary | ICD-10-CM

## 2018-09-06 MED ORDER — DOXYCYCLINE HYCLATE 100 MG PO TABS: 100.0000 mg | ORAL_TABLET | Freq: Two times a day (BID) | ORAL | 0 refills | Status: DC

## 2018-09-06 MED ORDER — PREDNISONE 10 MG PO TABS: ORAL_TABLET | ORAL | 0 refills | Status: DC

## 2018-09-06 NOTE — Patient Instructions (Addendum)
Doxycycline >>> 1 100 mg tablet every 12 hours for 7 days >>>take with food  >>>wear sunscreen   Prednisone 10mg  tablet  >>>4 tabs for 2 days, then 3 tabs for 2 days, 2 tabs for 2 days, then 1 tab for 2 days, then stop >>>take with food  >>>take in the morning   Note your daily symptoms > remember "red flags" for COPD:   >>>Increase in cough >>>increase in sputum production >>>increase in shortness of breath or activity  intolerance.   If you notice these symptoms, please call the office to be seen.   Only use your albuterol as a rescue medication to be used if you can't catch your breath by resting or doing a relaxed purse lip breathing pattern.  - The less you use it, the better it will work when you need it. - Ok to use up to 2 puffs  every 4 hours if you must but call for immediate appointment if use goes up over your usual need - Don't leave home without it !!  (think of it like the spare tire for your car)    Follow up in 4-6 weeks with Wyn Quaker FNP   It is flu season:   >>>Remember to be washing your hands regularly, using hand sanitizer, be careful to use around herself with has contact with people who are sick will increase her chances of getting sick yourself. >>> Best ways to protect herself from the flu: Receive the yearly flu vaccine, practice good hand hygiene washing with soap and also using hand sanitizer when available, eat a nutritious meals, get adequate rest, hydrate appropriately   Please contact the office if your symptoms worsen or you have concerns that you are not improving.   Thank you for choosing [facility] for your healthcare, and for allowing [place] to partner with you on your healthcare journey. I am thankful to be able to provide care to you today.   Wyn Quaker FNP-C

## 2018-09-06 NOTE — Assessment & Plan Note (Signed)
Doxycycline >>> 1 100 mg tablet every 12 hours for 7 days >>>take with food  >>>wear sunscreen   Prednisone 10mg  tablet  >>>4 tabs for 2 days, then 3 tabs for 2 days, 2 tabs for 2 days, then 1 tab for 2 days, then stop >>>take with food  >>>take in the morning   Only use your albuterol as a rescue medication to be used if you can't catch your breath by resting or doing a relaxed purse lip breathing pattern.  - The less you use it, the better it will work when you need it. - Ok to use up to 2 puffs  every 4 hours if you must but call for immediate appointment if use goes up over your usual need - Don't leave home without it !!  (think of it like the spare tire for your car)   Follow up in 4-6 weeks with Wyn Quaker FNP

## 2018-09-06 NOTE — Assessment & Plan Note (Signed)
Doxycycline >>> 1 100 mg tablet every 12 hours for 7 days >>>take with food  >>>wear sunscreen   Prednisone 10mg  tablet  >>>4 tabs for 2 days, then 3 tabs for 2 days, 2 tabs for 2 days, then 1 tab for 2 days, then stop >>>take with food  >>>take in the morning   Follow up in 4-6 weeks with Wyn Quaker FNP

## 2018-09-06 NOTE — Progress Notes (Signed)
@Patient  ID: Meredith Marshall, female    DOB: 1941/06/08, 77 y.o.   MRN: [id number]  Chief Complaint  Patient presents with  . Follow-up    pt states 13 days ago she developed sneezing, headache,  prod cough with green mucus, wheezing & chest tightness. pt stated sx have slightly improved with mucinex and tussin. sob is baseline    Referring provider: Crist Infante, MD  HPI:  77 year old female former smoker followed in our office for COPD Gold 1 emphysema  PMH: Barrett's esophagus, Cataract, HLD, HTN, Osteoporosis Smoker/ Smoking History: Former Smoker. Quit 2010. 35 pack year smoking history.  Maintenance:  none Pt of: Dr. Halford Chessman   09/06/2018  - Visit   77 year old female former smoker followed in our office for COPD and emphysema.  Presenting today for acute visit.  Patient reports she is had 13 days of increased shortness of breath, wheezing, productive cough with green mucus.  Mucus has increased from baseline.  Patient knows that she should have presented to our office sooner.  Patient's family was encouraging her to present sooner.  Patient reports that on just "stubborn".  Patient has not been treated with any antibiotics.  Throughout this entire course of illness patient has not used her rescue inhaler about 1 time.  Patient denies fever.  Patient believes her symptoms may be slowly resolving but wanted to be evaluated as it is been 13 days since she is fatigued.  MMRC - Breathlessness Score 2 - on level ground, I walk slower than people of the same age because of breathlessness, or have to stop for breathe when walking to my own pace     Tests:  PFT 06/04/09 >> FEV1 1.80(87%), FVC 2.96(103%), FEV1% 61, TLC 5.29(110%), DLCO 66%, no BD Spirometry 09/21/13 >> FEV1 1.49 (70%), FEV1% 61 CT chest 05/29/16 >> small Rt effusion FeNO 07/02/16 >> 28 Rt thoracentesis 07/08/16 >> glucose 151, protein 4, LDH 159, WBC 2032 (52L, 26M, 10N, 6E), cytology with reactive mesothelial cells  FENO:   Lab Results  Component Value Date   NITRICOXIDE 28 07/02/2016    PFT: No flowsheet data found.  Imaging: No results found.    Specialty Problems      Pulmonary Problems   COPD exacerbation ([place])           Acute bronchitis   COPD  GOLD I     Quit smoking 2010 PFT's  06/04/09   FEV1 1.89 (92 % ) ratio 63 with classic curvature   p 5  % improvement from saba p ? prior to study with DLCO  66 % corrects to 110 % for alv volume   - 09/02/2016  After extensive coaching HFA effectiveness =    75% > try off spiriva dpi due to dry chronic cough         Allergies  Allergen Reactions  . Carbamazepine     REACTION: hives, itching  . Phenobarbital     REACTION: hives, itching  . Phenytoin     REACTION: generic - hives/itching.  Pt reports she unable to tolerate the GENERIC but does well with the [facility] name  . Ramipril     REACTION: hives, itching  . Sulfonamide Derivatives     REACTION: hives, itching    Immunization History  Administered Date(s) Administered  . Influenza Split 04/09/2011, 05/09/2013, 05/10/2015  . Influenza Whole 06/08/2009, 05/09/2010, 06/08/2012  . Influenza, High Dose Seasonal PF 05/05/2016, 04/21/2018  . Influenza-Unspecified 06/07/2017  . Pneumococcal  Polysaccharide-23 12/07/2004, 09/08/2008  . Zoster 12/03/2016    Past Medical History:  Diagnosis Date  . Arthritis   . Barrett esophagus    Questionable?  . Blood transfusion without reported diagnosis 1967  . Cataract    removed both eyes - lens implants   . Cervical spine disease   . Chronic cystic mastitis 1971  . COPD (chronic obstructive pulmonary disease) ([place])   . Emphysema of lung ([place])   . 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 GERD (gastroesophageal reflux disease)   . Hemorrhoids   . Hyperlipidemia   . Hypertension   . Osteoporosis   . 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 Thyroiditis 1989  .  Tobacco abuse     Tobacco History: Social History   Tobacco Use  Smoking Status Former Smoker  . Packs/day: 1.00  . Years: 35.00  . Pack years: 35.00  . Types: Cigarettes  . Last attempt to quit: 11/06/2008  . Years since quitting: 9.8  Smokeless Tobacco Never Used   Counseling given: Not Answered  Continue to not smoke.  Outpatient Encounter Medications as of 09/06/2018  Medication Sig  . acetaminophen (TYLENOL) 500 MG tablet Take by mouth as needed.    [name] Kitchen albuterol (PROAIR HFA) 108 (90 Base) MCG/ACT inhaler Inhale 2 puffs into the lungs every 6 (six) hours as needed.  [name] Kitchen atorvastatin (LIPITOR) 20 MG tablet Take 20 mg by mouth at bedtime.    [name] Kitchen b complex vitamins tablet Take 1 tablet by mouth daily.  . carvedilol (COREG) 6.25 MG tablet Take 3.125 mg by mouth 2 (two) times daily with a meal.   . Cholecalciferol (VITAMIN D) 1000 UNITS capsule Take 2,000 Units by mouth.   [name] Kitchen guaiFENesin (MUCINEX) 600 MG 12 hr tablet Take by mouth 2 (two) times daily.  . phenytoin (DILANTIN) 100 MG ER capsule Take 100 mg by mouth 3 (three) times daily.    [name] Kitchen telmisartan (MICARDIS) 40 MG tablet Take 40 mg by mouth daily.    [name] Kitchen doxycycline (VIBRA-TABS) 100 MG tablet Take 1 tablet (100 mg total) by mouth 2 (two) times daily.  . predniSONE (DELTASONE) 10 MG tablet 4 tabs for 2 days, then 3 tabs for 2 days, 2 tabs for 2 days, then 1 tab for 2 days, then stop  . [DISCONTINUED] DEXILANT 60 MG capsule Take 60 mg by mouth daily as needed.    No facility-administered encounter medications on file as of 09/06/2018.      Review of Systems  Review of Systems  Constitutional: Positive for chills and fatigue. Negative for fever and unexpected weight change.  HENT: Positive for congestion. Negative for ear pain, postnasal drip, sinus pressure and sinus pain.   Respiratory: Positive for cough (productive cough - green mucous ), chest tightness, shortness of breath and wheezing.   Cardiovascular: Negative for chest  pain and palpitations.  Gastrointestinal: Negative for blood in stool, diarrhea, nausea and vomiting.  Genitourinary: Negative for hematuria.  Musculoskeletal: Negative for arthralgias.  Skin: Negative for color change.  Allergic/Immunologic: Negative for environmental allergies and food allergies.  Neurological: Positive for headaches. Negative for dizziness and light-headedness.  Psychiatric/Behavioral: Negative for dysphoric mood. The patient is not nervous/anxious.   All other systems reviewed and are negative.    Physical Exam  BP 140/82 (BP Location: Left Arm, Cuff Size: Normal)   Pulse 84   Temp  98.7 F (37.1 C) (Oral)   Ht 5\' 3"  (1.6 m)   Wt 221 lb (100.2 kg)   SpO2 94%   BMI 39.15 kg/m   Wt Readings from Last 5 Encounters:  09/06/18 221 lb (100.2 kg)  06/30/18 223 lb (101.2 kg)  06/29/18 222 lb (100.7 kg)  06/15/18 222 lb 12.8 oz (101.1 kg)  12/28/17 226 lb (102.5 kg)     Physical Exam  Constitutional: She is oriented to person, place, and time and well-developed, well-nourished, and in no distress. No distress.  HENT:  Head: Normocephalic and atraumatic.  Right Ear: Hearing, external ear and ear canal normal.  Left Ear: Hearing, external ear and ear canal normal.  Nose: Nose normal.  Mouth/Throat: Uvula is midline and oropharynx is clear and moist. No oropharyngeal exudate.  + TMs with effusion without infection bilaterally + Postnasal drip  Eyes: Pupils are equal, round, and reactive to light.  Neck: Normal range of motion. Neck supple.  Cardiovascular: Normal rate, regular rhythm and normal heart sounds.  Pulmonary/Chest: Effort normal. No accessory muscle usage. No respiratory distress. She has no decreased breath sounds. She has wheezes (Expiratory wheeze). She has no rhonchi.  Musculoskeletal: Normal range of motion.        General: No edema.  Lymphadenopathy:    She has no cervical adenopathy.  Neurological: She is alert and oriented to person, place,  and time. Gait normal.  Skin: Skin is warm and dry. She is not diaphoretic. No erythema.  Psychiatric: Mood, memory, affect and judgment normal.  Nursing note and vitals reviewed.     Lab Results:  CBC    Component Value Date/Time   WBC 11.8 (H) 05/16/2016 1239   RBC 4.26 05/16/2016 1239   HGB 12.5 05/16/2016 1239   HCT 36.4 05/16/2016 1239   PLT 302.0 05/16/2016 1239   MCV 85.5 05/16/2016 1239   MCHC 34.4 05/16/2016 1239   RDW 12.8 05/16/2016 1239   LYMPHSABS 1.2 05/16/2016 1239   MONOABS 1.4 (H) 05/16/2016 1239   EOSABS 0.5 05/16/2016 1239   BASOSABS 0.0 05/16/2016 1239    BMET    Component Value Date/Time   NA 122 (L) 05/16/2016 1239   K 4.6 05/16/2016 1239   CL 85 (L) 05/16/2016 1239   CO2 33 (H) 05/16/2016 1239   GLUCOSE 115 (H) 05/16/2016 1239   BUN 13 05/16/2016 1239   CREATININE 0.60 05/16/2016 1239   CALCIUM 9.1 05/16/2016 1239    BNP No results found for: BNP  ProBNP No results found for: PROBNP    Assessment & Plan:   77 year old female presenting today for acute visit.  Patient with slow to resolve COPD exacerbation and acute bronchitis.  Will treat with doxycycline prednisone.  Patient with previous positive Serratia in 2017.  Will treat with tetracycline family versus doing Cipro at this time.  If patient symptoms worsen or continue to not improve that may need to consider fluoroquinolone.  Patient to follow-up with our office in 4 to 6 weeks to ensure symptoms are improving.  COPD  GOLD I  Doxycycline >>> 1 100 mg tablet every 12 hours for 7 days >>>take with food  >>>wear sunscreen   Prednisone 10mg  tablet  >>>4 tabs for 2 days, then 3 tabs for 2 days, 2 tabs for 2 days, then 1 tab for 2 days, then stop >>>take with food  >>>take in the morning   Note your daily symptoms > remember "red flags" for COPD:   >>>Increase in  cough >>>increase in sputum production >>>increase in shortness of breath or activity  intolerance.   If you  notice these symptoms, please call the office to be seen.   Only use your albuterol as a rescue medication to be used if you can't catch your breath by resting or doing a relaxed purse lip breathing pattern.  - The less you use it, the better it will work when you need it. - Ok to use up to 2 puffs  every 4 hours if you must but call for immediate appointment if use goes up over your usual need - Don't leave home without it !!  (think of it like the spare tire for your car)    Follow up in 4-6 weeks with Wyn Quaker FNP   Acute bronchitis Doxycycline >>> 1 100 mg tablet every 12 hours for 7 days >>>take with food  >>>wear sunscreen   Prednisone 10mg  tablet  >>>4 tabs for 2 days, then 3 tabs for 2 days, 2 tabs for 2 days, then 1 tab for 2 days, then stop >>>take with food  >>>take in the morning   Only use your albuterol as a rescue medication to be used if you can't catch your breath by resting or doing a relaxed purse lip breathing pattern.  - The less you use it, the better it will work when you need it. - Ok to use up to 2 puffs  every 4 hours if you must but call for immediate appointment if use goes up over your usual need - Don't leave home without it !!  (think of it like the spare tire for your car)   Follow up in 4-6 weeks with Wyn Quaker FNP   COPD exacerbation ([place]) Doxycycline >>> 1 100 mg tablet every 12 hours for 7 days >>>take with food  >>>wear sunscreen   Prednisone 10mg  tablet  >>>4 tabs for 2 days, then 3 tabs for 2 days, 2 tabs for 2 days, then 1 tab for 2 days, then stop >>>take with food  >>>take in the morning   Follow up in 4-6 weeks with Tam[place], NP 09/06/2018   This appointment was 25 min long with over 50% of the time in direct face-to-face patient care, assessment, plan of care, and follow-up.

## 2018-09-06 NOTE — Assessment & Plan Note (Signed)
Doxycycline >>> 1 100 mg tablet every 12 hours for 7 days >>>take with food  >>>wear sunscreen   Prednisone 10mg  tablet  >>>4 tabs for 2 days, then 3 tabs for 2 days, 2 tabs for 2 days, then 1 tab for 2 days, then stop >>>take with food  >>>take in the morning   Note your daily symptoms > remember "red flags" for COPD:   >>>Increase in cough >>>increase in sputum production >>>increase in shortness of breath or activity  intolerance.   If you notice these symptoms, please call the office to be seen.   Only use your albuterol as a rescue medication to be used if you can't catch your breath by resting or doing a relaxed purse lip breathing pattern.  - The less you use it, the better it will work when you need it. - Ok to use up to 2 puffs  every 4 hours if you must but call for immediate appointment if use goes up over your usual need - Don't leave home without it !!  (think of it like the spare tire for your car)    Follow up in 4-6 weeks with Wyn Quaker FNP

## 2018-09-10 NOTE — Progress Notes (Signed)
Reviewed and agree with assessment/plan.   Erion Weightman, MD [facility] 09/03/2016, 12:24 PM Pager:  [phone]  

## 2018-10-11 ENCOUNTER — Ambulatory Visit: Payer: Medicare Other | Admitting: Pulmonary Disease

## 2019-10-06 ENCOUNTER — Ambulatory Visit: Payer: Medicare Other

## 2019-10-11 ENCOUNTER — Ambulatory Visit: Payer: Medicare Other

## 2019-10-15 ENCOUNTER — Ambulatory Visit: Payer: Medicare Other | Attending: Internal Medicine

## 2019-10-15 DIAGNOSIS — Z23 Encounter for immunization: Secondary | ICD-10-CM

## 2019-10-15 NOTE — Progress Notes (Signed)
   Covid-19 Vaccination Clinic  Name:  Meredith Marshall    MRN: [id number] DOB: 1940/10/29  10/15/2019  [name] was observed post Covid-19 immunization for 15 minutes without incidence. She was provided with Vaccine Information Sheet and instruction to access the V-Safe system.   [name] was instructed to call 911 with any severe reactions post vaccine: [name] Kitchen Difficulty breathing  . Swelling of your face and throat  . A fast heartbeat  . A bad rash all over your body  . Dizziness and weakness    Immunizations Administered    Name Date Dose VIS Date Route   Pfizer COVID-19 Vaccine 10/15/2019  9:24 AM 0.3 mL 08/19/2019 Intramuscular   Manufacturer: [place]   Lot: [id number]   [place]: [id number]

## 2019-11-09 ENCOUNTER — Ambulatory Visit: Payer: Medicare Other | Attending: Internal Medicine

## 2019-11-09 DIAGNOSIS — Z23 Encounter for immunization: Secondary | ICD-10-CM

## 2019-11-09 NOTE — Progress Notes (Signed)
   Covid-19 Vaccination Clinic  Name:  Meredith Marshall    MRN: [id number] DOB: 09/05/1941  11/09/2019  [name] was observed post Covid-19 immunization for 15 minutes without incident. She was provided with Vaccine Information Sheet and instruction to access the V-Safe system.   [name] was instructed to call 911 with any severe reactions post vaccine: [name] Kitchen Difficulty breathing  . Swelling of face and throat  . A fast heartbeat  . A bad rash all over body  . Dizziness and weakness   Immunizations Administered    Name Date Dose VIS Date Route   Pfizer COVID-19 Vaccine 11/09/2019  9:19 AM 0.3 mL 08/19/2019 Intramuscular   Manufacturer: [place]   Lot: [id number]   [place]: [id number]

## 2020-04-12 ENCOUNTER — Encounter: Payer: Self-pay | Admitting: Neurology

## 2020-06-21 ENCOUNTER — Encounter: Payer: Self-pay | Admitting: Neurology

## 2020-06-21 ENCOUNTER — Other Ambulatory Visit: Payer: Medicare Other

## 2020-06-21 ENCOUNTER — Other Ambulatory Visit: Payer: Self-pay

## 2020-06-21 ENCOUNTER — Ambulatory Visit: Payer: Medicare Other | Admitting: Neurology

## 2020-06-21 VITALS — BP 174/92 | HR 83 | Ht 63.0 in | Wt 210.0 lb

## 2020-06-21 DIAGNOSIS — G629 Polyneuropathy, unspecified: Secondary | ICD-10-CM | POA: Diagnosis not present

## 2020-06-21 LAB — B12 AND FOLATE PANEL
Folate: >24.8 ng/mL (ref >5.9)
Vitamin B-12: 777 pg/mL (ref 211–911)

## 2020-06-21 NOTE — Progress Notes (Signed)
[facility] Clinic Note - Initial Visit   Date: 06/21/20  [name] MRN: [id number] DOB: Jan 26, 1941   Dear Dr. Joylene Draft:  Thank you for your kind referral of Meredith Marshall for consultation of neuropathy. Although her history is well known to you, please allow Meredith Marshall to reiterate it for the purpose of our medical record. The patient was accompanied to the clinic by self.    History of Present Illness: Meredith Marshall is a 79 y.o. right-handed female with COPD, GERD, hyperlipidemia, hypertension, seizure, OA, and right ear deafness presenting for evaluation of neuropathy.   Starting about 2018, she began having numbness in the heels and toes.  Symptoms are constant, no alleviating or exacerbating factors.  Sometimes, she has stabbing pain in the feet, usually at nighttime.  She tried Lyrica for a few days and stopped it after reading potential side effects. She does not wish to take medication for pain. She walks unassisted and has not had any falls.  No weakness in the legs.  In March, she had the COVID vaccine and dilantin was changed to generic and since this time has noticed worsening tight sensation and numbness.  She has history of seizure and has been on dilantin 100mg  in the morning and 200mg  at bedtime for many years. She has been seizure-free since 1987. No history of diabetes, alcohol, or chemotherapy.   Out-side paper records, electronic medical record, and images have been reviewed where available and summarized as:  MRI brain wwo contrast 12/05/2015: Normal examination. No cause of right-sided hearing loss identified.  No vestibular schwannoma.  Labs 03/17/2020:  dilatnin 5.0*, TSH 1.9  Lab Results  Component Value Date   [id number] 01/18/2010   No results found for: TSH No results found for: ESRSEDRATE, POCTSEDRATE  Past Medical History:  Diagnosis Date  . Arthritis   . Barrett esophagus    Questionable?  . Blood transfusion without  reported diagnosis 1967  . Cataract    removed both eyes - lens implants   . Cervical spine disease   . Chronic cystic mastitis 1971  . COPD (chronic obstructive pulmonary disease) ([place])   . Emphysema of lung ([place])   . 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 GERD (gastroesophageal reflux disease)   . Hemorrhoids   . Hyperlipidemia   . Hypertension   . Osteoporosis   . Seizure ([place])    idiopathic nocturnal seizures - last seizure 1988- curroct [facility] pt [facility]15-18, last sz 1988   . Thyroiditis 1989  . Tobacco abuse     Past Surgical History:  Procedure Laterality Date  . BREAST BIOPSY  1972   bilateral/benign  . [place]   x 3  . COLONOSCOPY    . KNEE ARTHROSCOPY    . POLYPECTOMY     2015 x 1 polyp- + TA   . TONSILLECTOMY  1956  . trigger thumb Bilateral 2005, 2007     Medications:  Outpatient Encounter Medications as of 06/21/2020  Medication Sig  . acetaminophen (TYLENOL) 500 MG tablet Take by mouth as needed.    [name] Kitchen albuterol (PROAIR HFA) 108 (90 Base) MCG/ACT inhaler Inhale 2 puffs into the lungs every 6 (six) hours as needed.  [name] Kitchen atorvastatin (LIPITOR) 20 MG tablet Take 20 mg by mouth at bedtime.    [name] Kitchen b complex vitamins tablet Take 1 tablet by mouth daily.  . carvedilol (COREG) 6.25 MG tablet Take 3.125 mg  by mouth 2 (two) times daily with a meal.   . Cholecalciferol (VITAMIN D) 1000 UNITS capsule Take 2,000 Units by mouth.   [name] Kitchen guaiFENesin (MUCINEX) 600 MG 12 hr tablet Take by mouth 2 (two) times daily.  . phenytoin (DILANTIN) 100 MG ER capsule Take 100 mg by mouth 3 (three) times daily.    [name] Kitchen telmisartan (MICARDIS) 40 MG tablet Take 40 mg by mouth daily.    [name] Kitchen doxycycline (VIBRA-TABS) 100 MG tablet Take 1 tablet (100 mg total) by mouth 2 (two) times daily. (Patient not taking: Reported on 06/21/2020)  . predniSONE (DELTASONE) 10 MG tablet 4 tabs for 2 days, then 3 tabs for 2 days, 2 tabs for 2 days, then 1 tab for 2 days, then stop (Patient  not taking: Reported on 06/21/2020)  . [DISCONTINUED] DEXILANT 60 MG capsule Take 60 mg by mouth daily as needed.    No facility-administered encounter medications on file as of 06/21/2020.    Allergies:  Allergies  Allergen Reactions  . Carbamazepine     REACTION: hives, itching  . Phenobarbital     REACTION: hives, itching  . Phenytoin     REACTION: generic - hives/itching.  Pt reports she unable to tolerate the GENERIC but does well with the [facility] name  . Ramipril     REACTION: hives, itching  . Sulfonamide Derivatives     REACTION: hives, itching  . Sulfur     Family History: Family History  Problem Relation Age of Onset  . Lung cancer Mother   . Cancer Father        larynx  . Esophageal cancer Father   . Heart disease Sister   . Colon cancer Neg Hx   . Colon polyps Neg Hx   . Rectal cancer Neg Hx   . Stomach cancer Neg Hx     Social History: Social History   Tobacco Use  . Smoking status: Former Smoker    Packs/day: 1.00    Years: 35.00    Pack years: 35.00    Types: Cigarettes    Quit date: 11/06/2008    Years since quitting: 11.6  . Smokeless tobacco: Never Used  . Tobacco comment: Quit in 2010  Vaping Use  . Vaping Use: Never used  Substance Use Topics  . Alcohol use: No    Alcohol/week: 0.0 standard drinks  . Drug use: No   Social History   Social History Narrative   [place]   Right Handed   [employer] Story Home   Drinks no caffeine     Vital Signs:  BP (!) 174/92   Pulse 83   Ht 5\' 3"  (1.6 m)   Wt 210 lb (95.3 kg)   SpO2 96%   BMI 37.20 kg/m   Neurological Exam: MENTAL STATUS including orientation to time, place, person, recent and remote memory, attention span and concentration, language, and fund of knowledge is normal.  Speech is not dysarthric.  CRANIAL NERVES: II:  No visual field defects.   III-IV-VI: Pupils equal round and reactive.  Normal conjugate, extra-ocular eye movements in all directions of gaze.  No  nystagmus.  No ptosis.   VII:  Normal facial symmetry and movements.   VIII:  Reduced hearing on the right.   IX-X:  Normal palatal movement.   XI:  Normal shoulder shrug and head rotation.    MOTOR:  No atrophy, fasciculations or abnormal movements.  No pronator drift.   Upper Extremity:  Right  Left  Deltoid  5/5   5/5   Biceps  5/5   5/5   Triceps  5/5   5/5   Infraspinatus 5/5  5/5  Medial pectoralis 5/5  5/5  Wrist extensors  5/5   5/5   Wrist flexors  5/5   5/5   Finger extensors  5/5   5/5   Finger flexors  5/5   5/5   Dorsal interossei  5/5   5/5   Abductor pollicis  5/5   5/5   Tone (Ashworth scale)  0  0   Lower Extremity:  Right  Left  Hip flexors  5/5   5/5   Hip extensors  5/5   5/5   Adductor 5/5  5/5  Abductor 5/5  5/5  Knee flexors  5/5   5/5   Knee extensors  5/5   5/5   Dorsiflexors  5/5   5/5   Plantarflexors  5/5   5/5   Toe extensors  5/5   5/5   Toe flexors  5/5   5/5   Tone (Ashworth scale)  0  0   MSRs:  Right        Left                  brachioradialis 2+  2+  biceps 2+  2+  triceps 2+  2+  patellar 2+  2+  ankle jerk 0  0  Hoffman no  no  plantar response down  down   SENSORY:  Vibration is reduced at the ankles, temperature and pin prick reduced over the dorsum of the feet.   Romberg's sign absent.   COORDINATION/GAIT: Normal finger-to- nose-finger.  Intact rapid alternating movements bilaterally.  Able to rise from a chair without using arms.  Gait narrow based and stable. Tandem and stressed gait intact.    IMPRESSION: Peripheral neuropathy manifesting with numbness in the feet. 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 I had extensive discussion with the patient regarding the pathogenesis, etiology, management, and natural course of neuropathy. Neuropathy tends to be slowly progressive, especially if a treatable etiology is not identified.  I would like to test for treatable causes of  neuropathy. I discussed that in the vast majority of cases, despite checking for reversible causes, we are unable to find the underlying etiology and management is symptomatic.  She has no history of diabetes, alcohol use, or chemotherapy.  Dilantin can cause neuropathy, however, symptoms occurring this late is most likely age-related.   PLAN/RECOMMENDATIONS:  Check vitamin B12, folate, copper, SPEP with IFE OK to try OTC lidocaine ointment to feet Patient educated on daily foot inspection, fall prevention, and safety precautions around the home.  Return to clinic as needed  Thank you for allowing me to participate in patient's care.  If I can answer any additional questions, I would be pleased to do so.    Sincerely,    Valaree Fresquez K. Posey Pronto, DO

## 2020-06-21 NOTE — Patient Instructions (Addendum)
Check labs.  We will post your results to MyChart  Check your feet daily  Take extra caution on uneven ground  Return to clinic as needed

## 2020-06-25 ENCOUNTER — Telehealth: Payer: Self-pay

## 2020-06-25 LAB — PROTEIN ELECTROPHORESIS, SERUM
Albumin ELP: 4 g/dL (ref 3.8–4.8)
Alpha 1: 0.4 g/dL — ABNORMAL HIGH (ref 0.2–0.3)
Alpha 2: 1 g/dL — ABNORMAL HIGH (ref 0.5–0.9)
Beta 2: 0.5 g/dL (ref 0.2–0.5)
Beta Globulin: 0.5 g/dL (ref 0.4–0.6)
Gamma Globulin: 1.1 g/dL (ref 0.8–1.7)
Total Protein: 7.4 g/dL (ref 6.1–8.1)

## 2020-06-25 LAB — IMMUNOFIXATION ELECTROPHORESIS
IgG (Immunoglobin G), Serum: 1085 mg/dL (ref 600–1540)
IgM, Serum: 73 mg/dL (ref 50–300)
Immunofix Electr Int: NOT DETECTED
Immunoglobulin A: 353 mg/dL — ABNORMAL HIGH (ref 70–320)

## 2020-06-25 LAB — COPPER, SERUM: Copper: 162 ug/dL (ref 70–175)

## 2020-06-25 NOTE — Telephone Encounter (Signed)
-----   Message from Dane, DO sent at 06/25/2020 10:09 AM EDT ----- Let pt know that labs look good

## 2020-06-25 NOTE — Telephone Encounter (Signed)
Called patient and informed her of normal labs.

## 2020-07-06 ENCOUNTER — Ambulatory Visit: Payer: Medicare Other | Admitting: Neurology

## 2020-07-21 ENCOUNTER — Ambulatory Visit: Payer: Medicare Other | Attending: Internal Medicine

## 2020-07-21 ENCOUNTER — Other Ambulatory Visit: Payer: Self-pay

## 2020-07-21 DIAGNOSIS — Z23 Encounter for immunization: Secondary | ICD-10-CM

## 2020-07-21 NOTE — Progress Notes (Signed)
   Covid-19 Vaccination Clinic  Name:  Meredith Marshall    MRN: [id number] DOB: 12/31/1940  07/21/2020  [name] was observed post Covid-19 immunization for 15 minutes without incident. She was provided with Vaccine Information Sheet and instruction to access the V-Safe system.   [name] was instructed to call 911 with any severe reactions post vaccine: [name] Kitchen Difficulty breathing  . Swelling of face and throat  . A fast heartbeat  . A bad rash all over body  . Dizziness and weakness

## 2021-04-15 ENCOUNTER — Encounter: Payer: Self-pay | Admitting: Family Medicine

## 2021-07-03 ENCOUNTER — Telehealth: Payer: Self-pay

## 2021-07-03 NOTE — Telephone Encounter (Signed)
NOTES SCANNED TO REFERRAL 

## 2021-07-09 ENCOUNTER — Encounter: Payer: Self-pay | Admitting: Radiology

## 2021-07-09 ENCOUNTER — Encounter: Payer: Self-pay | Admitting: Cardiovascular Disease

## 2021-07-09 ENCOUNTER — Other Ambulatory Visit: Payer: Self-pay

## 2021-07-09 ENCOUNTER — Ambulatory Visit: Payer: Medicare Other | Admitting: Cardiovascular Disease

## 2021-07-09 VITALS — BP 146/70 | HR 87 | Ht 63.0 in | Wt 218.0 lb

## 2021-07-09 DIAGNOSIS — R072 Precordial pain: Secondary | ICD-10-CM | POA: Diagnosis not present

## 2021-07-09 DIAGNOSIS — R0789 Other chest pain: Secondary | ICD-10-CM | POA: Diagnosis not present

## 2021-07-09 DIAGNOSIS — R002 Palpitations: Secondary | ICD-10-CM

## 2021-07-09 LAB — BASIC METABOLIC PANEL
BUN/Creatinine Ratio: 25 (ref 12–28)
BUN: 14 mg/dL (ref 8–27)
CO2: 24 mmol/L (ref 20–29)
Calcium: 9.4 mg/dL (ref 8.7–10.3)
Chloride: 97 mmol/L (ref 96–106)
Creatinine, Ser: 0.55 mg/dL — ABNORMAL LOW (ref 0.57–1.00)
Glucose: 103 mg/dL — ABNORMAL HIGH (ref 70–99)
Potassium: 4.5 mmol/L (ref 3.5–5.2)
Sodium: 134 mmol/L (ref 134–144)
eGFR: 93 mL/min/{1.73_m2} (ref >59)

## 2021-07-09 MED ORDER — METOPROLOL TARTRATE 100 MG PO TABS: ORAL_TABLET | ORAL | 0 refills | Status: DC

## 2021-07-09 NOTE — Progress Notes (Signed)
Date:  07/09/2021   ID:  [name], DOB 1941-07-21, MRN [id number]  PCP:  Crist Infante, MD   [facility] Providers Cardiologist:  Bryce Kimble  }   Referring MD: Crist Infante, MD   Chief Complaint  Patient presents with   Palpitations     History of Present Illness:    Meredith Marshall is a 80 y.o. female with a hx of chest pain , COPD, dyspnea We were asked to see her for palpitations by Dr. Joylene Draft  Has COPD   1-2  Weeks of palpitations she thought it might be atrial fibrillation.  The episode resolved but she is continue to have intermittent episodes of these rapid and irregular heartbeat. Episodes now last for 10-15 min  No associated sweats, dyspnea, presyncope   No regular exercise Does not yard work but no real exercise   Her dyspnea has improved with Trilogy.  35 years of smoking   Has some DOE  Has some chest tightness   No presyncope  Has Ranauld phenom.      Past Medical History:  Diagnosis Date   Arthritis    Barrett esophagus    Questionable?   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     atorvastatin (LIPITOR) 20 MG tablet Take 20 mg by mouth at bedtime.     b complex vitamins tablet Take 1 tablet by mouth daily.   carvedilol (COREG) 6.25 MG tablet Take 3.125 mg by mouth 2 (two) times daily with a meal.    Cholecalciferol (VITAMIN D) 1000 UNITS capsule Take 2,000 Units by mouth.    Fluticasone-Umeclidin-Vilant 100-62.5-25 MCG/ACT AEPB Inhale 1 puff into the lungs daily.   metoprolol tartrate (LOPRESSOR) 100 MG tablet Take one tablet by mouth 2 hours prior to CT   phenytoin (DILANTIN) 100 MG ER capsule Take 100 mg by mouth 3 (three) times daily.     telmisartan (MICARDIS) 40 MG tablet Take 40 mg by mouth daily.       Allergies:   Carbamazepine, Elemental sulfur, Phenobarbital, Phenytoin, Ramipril, and Sulfonamide derivatives   Social History   Socioeconomic History   Marital status: Widowed    Spouse name: Not on file   Number of children: 2   Years of education: Not on file   Highest education level: Not on file  Occupational History  Occupation: retired Radiation protection practitioner for funeral [employer]  Tobacco Use   Smoking status: Former    Packs/day: 1.00    Years: 35.00    Pack years: 35.00    Types: Cigarettes    Quit date: 11/06/2008    Years since quitting: 12.6   Smokeless tobacco: Never   Tobacco comments:    Quit in 2010  Vaping Use   Vaping Use: Never used  Substance and Sexual Activity   Alcohol use: No    Alcohol/week: 0.0 standard drinks   Drug use: No   Sexual activity: Not on file  Other Topics Concern   Not on file  Social History Narrative   [place]   Right Handed   One [place] no caffeine    Social Determinants of Health   Financial Resource Strain: Not on file  Food Insecurity: Not on file  Transportation Needs: Not on file  Physical Activity: Not on file  Stress: Not on file  Social Connections: Not on file     Family History: The patient's family history includes Cancer in  her father; Esophageal cancer in her father; Heart disease in her sister; Lung cancer in her mother. There is no history of Colon cancer, Colon polyps, Rectal cancer, or Stomach cancer.  ROS:   Please see the history of present illness.     All other systems reviewed and are negative.  EKGs/Labs/Other Studies Reviewed:    The following studies were reviewed today:   EKG:   Nov. 1, 2022:   NSR at 85.  No ST or T wave changes.   Recent Labs: 07/09/2021: BUN 14; Creatinine, Ser 0.55; Potassium 4.5; Sodium 134  Recent Lipid Panel No results found for: CHOL, TRIG, HDL, CHOLHDL, VLDL, LDLCALC, LDLDIRECT   Risk Assessment/Calculations:           Physical Exam:    VS:  BP (!) 146/70 (BP Location: Left Arm, Patient Position: Sitting, Cuff Size: Normal)   Pulse 87   Ht 5\' 3"  (1.6 m)   Wt 218 lb (98.9 kg)   SpO2 97%   BMI 38.62 kg/m     Wt Readings from Last 3 Encounters:  07/09/21 218 lb (98.9 kg)  06/21/20 210 lb (95.3 kg)  09/06/18 221 lb (100.2 kg)     GEN: moderately obese female, NAD  HEENT: Normal NECK: No JVD; No carotid bruits LYMPHATICS: No lymphadenopathy CARDIAC: RRR, soft systolic murmur , no rubs, gallops RESPIRATORY:  Clear to auscultation without rales, wheezing or rhonchi  ABDOMEN: Soft, non-tender, non-distended MUSCULOSKELETAL:  No edema; No deformity  SKIN: Warm and dry NEUROLOGIC:  Alert and oriented x 3 PSYCHIATRIC:  Normal affect   ECG: July 09, 2021: Normal sinus rhythm at 87.  No ST or T wave changes.  ASSESSMENT:    1. Precordial pain   2. Chest tightness   3. Palpitations    PLAN:    In order of problems listed above:  Shortness of breath with exertion: She has some concerning episodes of shortness of breath and chest tightness with exertion.  She has a long history of cigarette smoking and history of hyperlipidemia.  I like to get a coronary CT angiogram for further evaluation of this.  2.  Palpitations: She is having some episodes  of rapid and irregular heartbeat.  She is concerned that she might have atrial fibrillation and I agree with her.  She is does have risk factors for A. fib.  We will place a 14-day ZIO live monitor.  3.  Morbid obesity:   I have stressed the importance of getting regular exercise and working on a weight loss program     Medication Adjustments/Labs and Tests Ordered: Current medicines are reviewed at length with the patient today.  Concerns regarding medicines are outlined above.  Orders Placed This Encounter  Procedures   CT CORONARY MORPH W/CTA COR W/SCORE W/CA W/CM &/OR WO/CM   Basic metabolic panel   Cardiac event monitor   EKG 12-Lead    Meds ordered this encounter  Medications   metoprolol tartrate (LOPRESSOR) 100 MG tablet    Sig: Take one tablet by mouth 2 hours prior to CT    Dispense:  1 tablet    Refill:  0    Patient Instructions  Medication Instructions:  Your physician recommends that you continue on your current medications as directed. Please refer to the Current Medication list given to you today.  *If you need a refill on your cardiac medications before your next appointment, please call your pharmacy*   Lab Work: BMET today   If you have labs (blood work) drawn today and your tests are completely normal, you will receive your results only by: [place] (if you have MyChart) OR A paper copy in the mail If you have any lab test that is abnormal or we need to change your treatment, we will call you to review the results.   Testing/Procedures: Your physician recommends that you have a Coronary CT performed.  Your physician has recommended that you wear an event monitor for 14 days. Event monitors are medical devices that record the heart's electrical activity. Doctors most often [place] these monitors to diagnose arrhythmias. Arrhythmias are problems with the speed or rhythm of the heartbeat. The monitor is a small, portable device. You can wear one while you  do your normal daily activities. This is usually used to diagnose what is causing palpitations/syncope (passing out).    Follow-Up: At [facility], you and your health needs are our priority.  As part of our continuing mission to provide you with exceptional heart care, we have created designated Provider Care Teams.  These Care Teams include your primary Cardiologist (physician) and Advanced Practice Providers (APPs -  Physician Assistants and Nurse Practitioners) who all work together to provide you with the care you need, when you need it.  We recommend signing up for the patient portal called "MyChart".  Sign up information is provided on this After Visit Summary.  MyChart is used to connect with patients for Virtual Visits (Telemedicine).  Patients are able to view lab/test results, encounter notes, upcoming appointments, etc.  Non-urgent messages can be sent to your provider as well.   To learn more about what you can do with MyChart, go to [email or web address].    Your next appointment:   3 month(s)  The format for your next appointment:   In Person  Provider:   You may see Mertie Moores, MD or one of the following Advanced Practice Providers on your designated Care Team:   Richardson Dopp, PA-C [place], [place]   Other Instructions    Your cardiac CT will be scheduled at one of the below locations:   [facility] [address] [place], [place] [address] ([phone] [place] [address] [place], [place] [address] [phone]  If scheduled at [facility], please arrive at the [facility] main entrance (entrance A) of [facility] 30 minutes prior to test start time. You can use the FREE valet parking offered at the main entrance (encouraged to control the heart rate for the test) Proceed to the [facility] Radiology Department (first floor) to check-in and test prep.  If  scheduled at [facility], please arrive 15 mins early for check-in and test prep.  Please follow these instructions carefully (unless otherwise directed):   On the Night Before the Test: Be sure to Drink plenty of water. Do not consume any caffeinated/decaffeinated beverages or chocolate 12 hours prior to your test. Do not take any antihistamines 12 hours prior to your test.  On the Day of the Test: Drink plenty of water until 1 hour prior to the test. Do not eat any food 4 hours prior to the test. You may take your regular medications prior to the test.  Take metoprolol (Lopressor) two hours prior to test. HOLD Furosemide/Hydrochlorothiazide morning of the test. FEMALES- please wear underwire-free bra if available, avoid dresses & tight clothing       After the Test: Drink plenty of water. After receiving IV contrast, you may experience a mild flushed feeling. This is normal. On occasion, you may experience a mild rash up to 24 hours after the test. This is not dangerous. If this occurs, you can take Benadryl 25 mg and increase your fluid intake. If you experience trouble breathing, this can be serious. If it is severe call 911 IMMEDIATELY. If it is mild, please call our office. If you take any of these medications: Glipizide/Metformin, Avandament, Glucavance, please do not take 48 hours after completing test unless otherwise instructed.  Please allow 2-4 weeks for scheduling of routine cardiac CTs. Some insurance companies require a pre-authorization which may delay scheduling of this test.   For non-scheduling related questions, please contact the cardiac imaging nurse navigator should you have any questions/concerns: Marchia Bond, Cardiac Imaging Nurse Navigator Gordy Clement, Cardiac Imaging Nurse Navigator [facility] Direct Office Dial: [phone]   For scheduling needs, including cancellations and rescheduling, please  call [place], [phone].    [employer] Cardiac Event Monitor Instructions Your physician has requested you wear your cardiac event monitor for 14 days. [employer] may call or text to confirm a shipping address. The monitor will be sent to a land address via UPS. [employer] will not ship a monitor to a PO BOX. It typically takes 3-5 days to receive your monitor after it has been enrolled. [employer] will assist with USPS tracking if your package is delayed. The telephone number for [employer] is [phone]. Once you have received your monitor, please review the enclosed instructions. Instruction tutorials can also be viewed under help and settings on the enclosed cell phone. Your monitor has already been registered assigning a specific monitor serial # to you.  Applying the monitor Remove cell phone from case and turn it on. The cell phone works as Dealer and needs to be within [employer] of you at all times. The cell phone will need to be charged on a daily basis. We recommend you plug the cell phone into the enclosed charger at your bedside table every night.  Monitor batteries: You will receive two monitor batteries labelled #1 and #2. These are your recorders. Plug battery #2 onto the second connection on the enclosed charger. Keep one battery on the charger at all times. This will keep the monitor battery deactivated. It will  also keep it fully charged for when you need to switch your monitor batteries. A small light will be blinking on the battery emblem when it is charging. The light on the battery emblem will remain on when the battery is fully charged.  Open package of a Monitor strip. Insert battery #1 into black hood on strip and gently squeeze monitor battery onto connection as indicated in instruction booklet. Set aside while preparing skin.  Choose location for your strip, vertical or horizontal, as indicated in the instruction booklet. Shave to remove all hair  from location. There cannot be any lotions, oils, powders, or colognes on skin where monitor is to be applied. Wipe skin clean with enclosed Saline wipe. Dry skin completely.  Peel paper labeled #1 off the back of the Monitor strip exposing the adhesive. Place the monitor on the chest in the vertical or horizontal position shown in the instruction booklet. One arrow on the monitor strip must be pointing upward. Carefully remove paper labeled #2, attaching remainder of strip to your skin. Try not to create any folds or wrinkles in the strip as you apply it.  Firmly press and release the circle in the center of the monitor battery. You will hear a small beep. This is turning the monitor battery on. The heart emblem on the monitor battery will light up every 5 seconds if the monitor battery in turned on and connected to the patient securely. Do not push and hold the circle down as this turns the monitor battery off. The cell phone will locate the monitor battery. A screen will appear on the cell phone checking the connection of your monitor strip. This may read poor connection initially but change to good connection within the next minute. Once your monitor accepts the connection you will hear a series of 3 beeps followed by a climbing crescendo of beeps. A screen will appear on the cell phone showing the two monitor strip placement options. Touch the picture that demonstrates where you applied the monitor strip.  Your monitor strip and battery are waterproof. You are able to shower, bathe, or swim with the monitor on. They just ask you do not submerge deeper than 3 feet underwater. We recommend removing the monitor if you are swimming in a lake, river, or ocean.  Your monitor battery will need to be switched to a fully charged monitor battery approximately once a week. The cell phone will alert you of an action which needs to be made.  On the cell phone, tap for details to reveal connection  status, monitor battery status, and cell phone battery status. The green dots indicates your monitor is in good status. A red dot indicates there is something that needs your attention.  To record a symptom, click the circle on the monitor battery. In 30-60 seconds a list of symptoms will appear on the cell phone. Select your symptom and tap save. Your monitor will record a sustained or significant arrhythmia regardless of you clicking the button. Some patients do not feel the heart rhythm irregularities. [employer] will notify us of any serious or critical events.  Refer to instruction booklet for instructions on switching batteries, changing strips, the Do not disturb or Pause features, or any additional questions.  Call [employer] at [phone], to confirm your monitor is transmitting and record your baseline. They will answer any questions you may have regarding the monitor instructions at that time.  Returning the monitor to [place] all equipment back into blue box.  Peel off strip of paper to expose adhesive and close box securely. There is a prepaid UPS shipping label on this box. Drop in a UPS drop box, or at a UPS facility like [employer]. You may also contact [employer] to arrange UPS to pick up monitor package at your home.   Signed, Mertie Moores, MD  07/09/2021 5:06 PM    [place]

## 2021-07-09 NOTE — Progress Notes (Signed)
Enrolled patient for a 14 day [employer] Event monitor to be mailed to patients home

## 2021-07-09 NOTE — Patient Instructions (Addendum)
Medication Instructions:  Your physician recommends that you continue on your current medications as directed. Please refer to the Current Medication list given to you today.  *If you need a refill on your cardiac medications before your next appointment, please call your pharmacy*   Lab Work: BMET today   If you have labs (blood work) drawn today and your tests are completely normal, you will receive your results only by: [place] (if you have MyChart) OR A paper copy in the mail If you have any lab test that is abnormal or we need to change your treatment, we will call you to review the results.   Testing/Procedures: Your physician recommends that you have a Coronary CT performed.  Your physician has recommended that you wear an event monitor for 14 days. Event monitors are medical devices that record the heart's electrical activity. Doctors most often [place] these monitors to diagnose arrhythmias. Arrhythmias are problems with the speed or rhythm of the heartbeat. The monitor is a small, portable device. You can wear one while you do your normal daily activities. This is usually used to diagnose what is causing palpitations/syncope (passing out).    Follow-Up: At [facility], you and your health needs are our priority.  As part of our continuing mission to provide you with exceptional heart care, we have created designated Provider Care Teams.  These Care Teams include your primary Cardiologist (physician) and Advanced Practice Providers (APPs -  Physician Assistants and Nurse Practitioners) who all work together to provide you with the care you need, when you need it.  We recommend signing up for the patient portal called "MyChart".  Sign up information is provided on this After Visit Summary.  MyChart is used to connect with patients for Virtual Visits (Telemedicine).  Patients are able to view lab/test results, encounter notes, upcoming appointments, etc.  Non-urgent messages can  be sent to your provider as well.   To learn more about what you can do with MyChart, go to [email or web address].    Your next appointment:   3 month(s)  The format for your next appointment:   In Person  Provider:   You may see Mertie Moores, MD or one of the following Advanced Practice Providers on your designated Care Team:   Richardson Dopp, PA-C [place], [place]   Other Instructions    Your cardiac CT will be scheduled at one of the below locations:   [facility] [address] [place], [place] [address] [phone]  [place] [address] [place], [place] [address] [phone]  If scheduled at [facility], please arrive at the [facility] main entrance (entrance A) of [facility] 30 minutes prior to test start time. You can use the FREE valet parking offered at the main entrance (encouraged to control the heart rate for the test) Proceed to the [facility] Radiology Department (first floor) to check-in and test prep.  If scheduled at [facility], please arrive 15 mins early for check-in and test prep.  Please follow these instructions carefully (unless otherwise directed):   On the Night Before the Test: Be sure to Drink plenty of water. Do not consume any caffeinated/decaffeinated beverages or chocolate 12 hours prior to your test. Do not take any antihistamines 12 hours prior to your test.  On the Day of the Test: Drink plenty of water until 1 hour prior to the test. Do not eat any  food 4 hours prior to the test. You may take your regular medications prior to the test.  Take metoprolol (Lopressor) two hours prior to test. HOLD Furosemide/Hydrochlorothiazide morning of the test. FEMALES- please wear underwire-free bra if available, avoid dresses & tight clothing       After the Test: Drink plenty of water. After receiving IV contrast, you may  experience a mild flushed feeling. This is normal. On occasion, you may experience a mild rash up to 24 hours after the test. This is not dangerous. If this occurs, you can take Benadryl 25 mg and increase your fluid intake. If you experience trouble breathing, this can be serious. If it is severe call 911 IMMEDIATELY. If it is mild, please call our office. If you take any of these medications: Glipizide/Metformin, Avandament, Glucavance, please do not take 48 hours after completing test unless otherwise instructed.  Please allow 2-4 weeks for scheduling of routine cardiac CTs. Some insurance companies require a pre-authorization which may delay scheduling of this test.   For non-scheduling related questions, please contact the cardiac imaging nurse navigator should you have any questions/concerns: Marchia Bond, Cardiac Imaging Nurse Navigator Gordy Clement, Cardiac Imaging Nurse Navigator  [facility] V[facility] Services Direct Office Dial: [phone]   For scheduling needs, including cancellations and rescheduling, please call [place], [phone].    [employer] Cardiac Event Monitor Instructions Your physician has requested you wear your cardiac event monitor for 14 days. [employer] may call or text to confirm a shipping address. The monitor will be sent to a land address via UPS. [employer] will not ship a monitor to a PO BOX. It typically takes 3-5 days to receive your monitor after it has been enrolled. [employer] will assist with USPS tracking if your package is delayed. The telephone number for [employer] is [phone]. Once you have received your monitor, please review the enclosed instructions. Instruction tutorials can also be viewed under help and settings on the enclosed cell phone. Your monitor has already been registered assigning a specific monitor serial # to you.  Applying the monitor Remove cell phone from case and turn it on. The cell phone works as Ship broker and needs to be within [employer] of you at all times. The cell phone will need to be charged on a daily basis. We recommend you plug the cell phone into the enclosed charger at your bedside table every night.  Monitor batteries: You will receive two monitor batteries labelled #1 and #2. These are your recorders. Plug battery #2 onto the second connection on the enclosed charger. Keep one battery on the charger at all times. This will keep the monitor battery deactivated. It will also keep it fully charged for when you need to switch your monitor batteries. A small light will be blinking on the battery emblem when it is charging. The light on the battery emblem will remain on when the battery is fully charged.  Open package of a Monitor strip. Insert battery #1 into black hood on strip and gently squeeze monitor battery onto connection as indicated in instruction booklet. Set aside while preparing skin.  Choose location for your strip, vertical or horizontal, as indicated in the instruction booklet. Shave to remove all hair from location. There cannot be any lotions, oils, powders, or colognes on skin where monitor is to be applied. Wipe skin clean with enclosed Saline wipe. Dry skin completely.  Peel paper labeled #1 off the back of the Monitor strip exposing the adhesive. Place  the monitor on the chest in the vertical or horizontal position shown in the instruction booklet. One arrow on the monitor strip must be pointing upward. Carefully remove paper labeled #2, attaching remainder of strip to your skin. Try not to create any folds or wrinkles in the strip as you apply it.  Firmly press and release the circle in the center of the monitor battery. You will hear a small beep. This is turning the monitor battery on. The heart emblem on the monitor battery will light up every 5 seconds if the monitor battery in turned on and connected to the patient securely. Do not push and hold the  circle down as this turns the monitor battery off. The cell phone will locate the monitor battery. A screen will appear on the cell phone checking the connection of your monitor strip. This may read poor connection initially but change to good connection within the next minute. Once your monitor accepts the connection you will hear a series of 3 beeps followed by a climbing crescendo of beeps. A screen will appear on the cell phone showing the two monitor strip placement options. Touch the picture that demonstrates where you applied the monitor strip.  Your monitor strip and battery are waterproof. You are able to shower, bathe, or swim with the monitor on. They just ask you do not submerge deeper than 3 feet underwater. We recommend removing the monitor if you are swimming in a lake, river, or ocean.  Your monitor battery will need to be switched to a fully charged monitor battery approximately once a week. The cell phone will alert you of an action which needs to be made.  On the cell phone, tap for details to reveal connection status, monitor battery status, and cell phone battery status. The green dots indicates your monitor is in good status. A red dot indicates there is something that needs your attention.  To record a symptom, click the circle on the monitor battery. In 30-60 seconds a list of symptoms will appear on the cell phone. Select your symptom and tap save. Your monitor will record a sustained or significant arrhythmia regardless of you clicking the button. Some patients do not feel the heart rhythm irregularities. [employer] will notify us of any serious or critical events.  Refer to instruction booklet for instructions on switching batteries, changing strips, the Do not disturb or Pause features, or any additional questions.  Call [employer] at [phone], to confirm your monitor is transmitting and record your baseline. They will answer any questions you may have  regarding the monitor instructions at that time.  Returning the monitor to [place] all equipment back into blue box. Peel off strip of paper to expose adhesive and close box securely. There is a prepaid UPS shipping label on this box. Drop in a UPS drop box, or at a UPS facility like [employer]. You may also contact [employer] to arrange UPS to pick up monitor package at your home.

## 2021-07-17 ENCOUNTER — Telehealth (HOSPITAL_COMMUNITY): Payer: Self-pay | Admitting: Emergency Medicine

## 2021-07-17 NOTE — Telephone Encounter (Signed)
Attempted to call patient regarding upcoming cardiac CT appointment. °Left message on voicemail with name and callback number °Fern Asmar RN Navigator Cardiac Imaging °[facility] °[phone] Office °[phone] Cell ° °

## 2021-07-17 NOTE — Telephone Encounter (Signed)
Attempted to call patient regarding upcoming cardiac CT appointment. °Left message on voicemail with name and callback number °Brentley Horrell RN Navigator Cardiac Imaging °[place] [facility] °[phone] Office °[phone] Cell ° °

## 2021-07-19 ENCOUNTER — Encounter (HOSPITAL_COMMUNITY): Payer: Self-pay

## 2021-07-19 ENCOUNTER — Ambulatory Visit (HOSPITAL_COMMUNITY)
Admission: RE | Admit: 2021-07-19 | Discharge: 2021-07-19 | Disposition: A | Discharge status: Home / Self Care | Payer: Medicare Other | Source: Ambulatory Visit | Attending: Cardiovascular Disease | Admitting: Cardiovascular Disease

## 2021-07-19 ENCOUNTER — Other Ambulatory Visit: Payer: Self-pay

## 2021-07-19 DIAGNOSIS — K449 Diaphragmatic hernia without obstruction or gangrene: Secondary | ICD-10-CM | POA: Diagnosis not present

## 2021-07-19 DIAGNOSIS — I700 Atherosclerosis of aorta: Secondary | ICD-10-CM | POA: Insufficient documentation

## 2021-07-19 DIAGNOSIS — R0789 Other chest pain: Secondary | ICD-10-CM | POA: Insufficient documentation

## 2021-07-19 DIAGNOSIS — I2510 Atherosclerotic heart disease of native coronary artery without angina pectoris: Secondary | ICD-10-CM | POA: Insufficient documentation

## 2021-07-19 MED ORDER — IOHEXOL 350 MG/ML SOLN
95.0000 mL | Freq: Once | INTRAVENOUS | Status: AC | PRN
  Administered 2021-07-19: 95 mL via INTRAVENOUS

## 2021-07-19 MED ORDER — NITROGLYCERIN 0.4 MG SL SUBL
0.8000 mg | SUBLINGUAL_TABLET | Freq: Once | SUBLINGUAL | Status: AC
  Administered 2021-07-19: 0.8 mg via SUBLINGUAL

## 2021-07-19 MED ORDER — NITROGLYCERIN 0.4 MG SL SUBL
SUBLINGUAL_TABLET | SUBLINGUAL | Status: AC
  Filled 2021-07-19: qty 2

## 2021-07-20 ENCOUNTER — Ambulatory Visit (INDEPENDENT_AMBULATORY_CARE_PROVIDER_SITE_OTHER): Payer: Medicare Other

## 2021-07-20 DIAGNOSIS — R002 Palpitations: Secondary | ICD-10-CM

## 2021-10-09 ENCOUNTER — Encounter: Payer: Self-pay | Admitting: Physician Assistant

## 2021-10-09 ENCOUNTER — Other Ambulatory Visit: Payer: Self-pay

## 2021-10-09 ENCOUNTER — Ambulatory Visit: Payer: Medicare Other | Admitting: Physician Assistant

## 2021-10-09 DIAGNOSIS — I2510 Atherosclerotic heart disease of native coronary artery without angina pectoris: Secondary | ICD-10-CM | POA: Diagnosis not present

## 2021-10-09 DIAGNOSIS — I700 Atherosclerosis of aorta: Secondary | ICD-10-CM

## 2021-10-09 DIAGNOSIS — R911 Solitary pulmonary nodule: Secondary | ICD-10-CM

## 2021-10-09 DIAGNOSIS — R002 Palpitations: Secondary | ICD-10-CM

## 2021-10-09 HISTORY — DX: Atherosclerosis of aorta: I70.0

## 2021-10-09 HISTORY — DX: Palpitations: R00.2

## 2021-10-09 HISTORY — DX: Atherosclerotic heart disease of native coronary artery without angina pectoris: I25.10

## 2021-10-09 HISTORY — DX: Solitary pulmonary nodule: R91.1

## 2021-10-09 NOTE — Assessment & Plan Note (Signed)
She has not had recurrent palpitations since she was last seen.  Her monitor was unremarkable.  We discussed the possibility of getting an apple watch or Kardia device should she have recurrent palpitations in the future.

## 2021-10-09 NOTE — Assessment & Plan Note (Signed)
She had mild to moderate nonobstructive disease on coronary CTA in 11/22.  She has not had any further chest discomfort to suggest angina.  She does have a history of peptic ulcer disease.  Therefore, I do not recommend that she take daily aspirin.  She should continue atorvastatin 20 mg daily.  Goal LDL <70.  She will have follow-up labs with primary care later this year.  If her LDL remains above 70, consider increasing atorvastatin to 40 mg daily.  Follow-up with Dr. Acie Fredrickson in 6 months.

## 2021-10-09 NOTE — Assessment & Plan Note (Signed)
She is a prior smoker.  Recommendation is for repeat chest CT in 1 year.  I offered to order this for her.  She would like to follow-up with her PCP and have it done through him.

## 2021-10-09 NOTE — Progress Notes (Signed)
Cardiology Office Note:    Date:  10/09/2021   ID:  [name], DOB 1941-04-29, MRN [id number]  PCP:  Crist Infante, MD  [facility] Providers Cardiologist:  Mertie Moores, MD    Referring MD: Crist Infante, MD   Chief Complaint:  Follow-up for CAD    Patient Profile: Coronary artery disease Mild nonobstructive disease by coronary CTA 11/22 COPD Palpitations Monitor 11/22: Normal GERD Hyperlipidemia History of seizures Pulmonary nodule CT 11/22: Right middle lobe 4 mm Aortic atherosclerosis   Prior CV Studies: Event monitor 07/2021 Normal sinus rhythm; no significant arrhythmias  Coronary CTA 07/19/2021 CAC score 174 (60th percentile) RCA ostial 1-24, proximal 1-24 LAD mid 25-49 LCx proximal 1-24 Ascending aorta 34 mm Right middle lobe nodule 4 mm Aortic atherosclerosis  Carotid US 05/06/2016 Bilateral ICA <40%  Myoview 11/11/2011 No ischemia, EF 84; normal study    History of Present Illness:   Meredith Marshall is a 82 y.o. female with the above problem list.  She was evaluated by Dr. Acie Fredrickson 07/09/2021 for chest pain and palpitations.  Coronary CTA was performed and demonstrated a calcium score 174 (60th percentile) and mild nonobstructive CAD.  Event monitor was obtained and demonstrated no significant arrhythmias.  She returns for follow-up.  She is here alone.  Since last seen, she has done well without chest pain.  She was started on Trelegy.  Her breathing has improved.  She has not had further palpitations.  She has not had syncope.    Past Medical History:  Diagnosis Date   Aortic atherosclerosis ([place]) 10/09/2021   CT 11/22   Arthritis    Barrett esophagus    Questionable?   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     atorvastatin (LIPITOR) 20 MG tablet Take 20 mg by mouth at bedtime.     b complex vitamins tablet Take 1 tablet by mouth daily.   carvedilol (COREG) 6.25 MG tablet Take 3.125 mg by mouth 2 (two) times daily with a meal.    Cholecalciferol (VITAMIN D) 1000 UNITS capsule Take 2,000 Units by mouth.    Fluticasone-Umeclidin-Vilant 100-62.5-25 MCG/ACT AEPB Inhale 1 puff into the lungs daily.   metoprolol tartrate (LOPRESSOR) 100 MG tablet Take one tablet by mouth 2 hours prior to CT   metroNIDAZOLE (METROGEL) 1 % gel Apply 1 application topically daily.   phenytoin (DILANTIN) 100 MG ER capsule Take 100 mg by mouth 3 (three) times daily.     telmisartan (MICARDIS) 40 MG tablet  Take 40 mg by mouth daily.      Allergies:   Carbamazepine, Elemental sulfur, Phenobarbital, Phenytoin, Ramipril, and Sulfonamide derivatives   Social History   Tobacco Use   Smoking status: Former    Packs/day: 1.00    Years: 35.00    Pack years: 35.00    Types: Cigarettes    Quit date: 11/06/2008    Years since quitting: 12.9   Smokeless tobacco: Never   Tobacco comments:    Quit in 2010  Vaping Use   Vaping Use: Never used  Substance Use Topics   Alcohol use: No    Alcohol/week: 0.0 standard drinks   Drug use: No    Family Hx: The patient's family history includes Cancer in her father; Esophageal cancer in  her father; Heart disease in her sister; Lung cancer in her mother. There is no history of Colon cancer, Colon polyps, Rectal cancer, or Stomach cancer.  ROS see HPI  EKGs/Labs/Other Test Reviewed:    EKG:  EKG is not ordered today.  The ekg ordered today demonstrates n/a  Recent Labs: 07/09/2021: BUN 14; Creatinine, Ser 0.55; Potassium 4.5; Sodium 134   Recent Lipid Panel No results for input(s): CHOL, TRIG, HDL, VLDL, LDLCALC, LDLDIRECT in the last 8760 hours.   Risk Assessment/Calculations:         Physical Exam:    VS:  BP 128/60 (BP Location: Left Arm)    Pulse 60    Ht 5\' 3"  (1.6 m)    Wt 215 lb 12.8 oz (97.9 kg)    SpO2 96%    BMI 38.23 kg/m     Wt Readings from Last 3 Encounters:  10/09/21 215 lb 12.8 oz (97.9 kg)  07/09/21 218 lb (98.9 kg)  06/21/20 210 lb (95.3 kg)    Constitutional:      Appearance: Healthy appearance. Not in distress.  Neck:     Vascular: No JVR. JVD normal.  Pulmonary:     Effort: Pulmonary effort is normal.     Breath sounds: No wheezing. No rales.  Cardiovascular:     Normal rate. Regular rhythm. Normal S1. Normal S2.      Murmurs: There is a grade 1/6 low frequency early systolic murmur at the URSB.  Edema:    Peripheral edema absent.  Abdominal:     Palpations: Abdomen is soft.  Skin:    General: Skin is warm and dry.  Neurological:     Mental Status: Alert and oriented to person, place and time.     Cranial Nerves: Cranial nerves are intact.        ASSESSMENT & PLAN:   CAD (coronary artery disease) She had mild to moderate nonobstructive disease on coronary CTA in 11/22.  She has not had any further chest discomfort to suggest angina.  She does have a history of peptic ulcer disease.  Therefore, I do not recommend that she take daily aspirin.  She should continue atorvastatin 20 mg daily.  Goal LDL <70.  She will have follow-up labs with primary care later this year.  If her LDL remains above 70, consider increasing atorvastatin to  40 mg daily.  Follow-up with Dr. Acie Fredrickson in 6 months.  Palpitations She has not had recurrent palpitations since she was last seen.  Her monitor was unremarkable.  We discussed the possibility of getting an apple watch or Kardia device should she have recurrent palpitations in the future.  Pulmonary nodule She is a prior smoker.  Recommendation is for  repeat chest CT in 1 year.  I offered to order this for her.  She would like to follow-up with her PCP and have it done through him.  Aortic atherosclerosis (HCC) Continue atorvastatin 20 mg daily.  Given her age and prior peptic ulcer disease history, I do not think she should start taking a daily aspirin.           Dispo:  Return in about 6 months (around 04/08/2022) for Routine follow up in 6 months with Dr. Cathie Olden. .   Medication Adjustments/Labs and Tests Ordered: Current medicines are reviewed at length with the patient today.  Concerns regarding medicines are outlined above.  Tests Ordered: No orders of the defined types were placed in this encounter.  Medication Changes: No orders of the defined types were placed in this encounter.  Signed, Richardson Dopp, PA-C  10/09/2021 10:24 AM    [facility] [place], [place], [place]  [address] Phone: [phone]; Fax: [phone]

## 2021-10-09 NOTE — Patient Instructions (Addendum)
Medication Instructions:  No changes.  *If you need a refill on your cardiac medications before your next appointment, please call your pharmacy*   Lab Work: We will request a copy of your cholesterol labs that will be done later this year with Crist Infante, MD  Your LDL cholesterol goal is less than 70  If you have labs (blood work) drawn today and your tests are completely normal, you will receive your results only by: [place] (if you have MyChart) OR A paper copy in the mail If you have any lab test that is abnormal or we need to change your treatment, we will call you to review the results.   Follow-Up: At [facility], you and your health needs are our priority.  As part of our continuing mission to provide you with exceptional heart care, we have created designated Provider Care Teams.  These Care Teams include your primary Cardiologist (physician) and Advanced Practice Providers (APPs -  Physician Assistants and Nurse Practitioners) who all work together to provide you with the care you need, when you need it.  We recommend signing up for the patient portal called "MyChart".  Sign up information is provided on this After Visit Summary.  MyChart is used to connect with patients for Virtual Visits (Telemedicine).  Patients are able to view lab/test results, encounter notes, upcoming appointments, etc.  Non-urgent messages can be sent to your provider as well.   To learn more about what you can do with MyChart, go to [email or web address].    Your next appointment:   6 month(s)  The format for your next appointment:   In Person  Provider:   Dr. Acie Fredrickson  If primary card or EP is not listed click here to update    :1}    Other Instructions:  Follow up with PCP for repeat chest CT in November 2023.

## 2021-10-09 NOTE — Assessment & Plan Note (Signed)
Continue atorvastatin 20 mg daily.  Given her age and prior peptic ulcer disease history, I do not think she should start taking a daily aspirin.

## 2022-04-15 ENCOUNTER — Other Ambulatory Visit: Payer: Self-pay | Admitting: Internal Medicine

## 2022-04-15 DIAGNOSIS — R918 Other nonspecific abnormal finding of lung field: Secondary | ICD-10-CM

## 2022-04-22 ENCOUNTER — Ambulatory Visit: Payer: Medicare Other | Admitting: Cardiovascular Disease

## 2022-04-30 ENCOUNTER — Other Ambulatory Visit (HOSPITAL_COMMUNITY): Payer: Self-pay | Admitting: Internal Medicine

## 2022-04-30 ENCOUNTER — Ambulatory Visit (HOSPITAL_COMMUNITY)
Admission: RE | Admit: 2022-04-30 | Discharge: 2022-04-30 | Disposition: A | Discharge status: Home / Self Care | Payer: Medicare Other | Source: Ambulatory Visit | Attending: Vascular Surgery | Admitting: Vascular Surgery

## 2022-04-30 DIAGNOSIS — I6529 Occlusion and stenosis of unspecified carotid artery: Secondary | ICD-10-CM | POA: Insufficient documentation

## 2022-07-16 ENCOUNTER — Ambulatory Visit
Admission: RE | Admit: 2022-07-16 | Discharge: 2022-07-16 | Disposition: A | Discharge status: Home / Self Care | Payer: Medicare Other | Source: Ambulatory Visit | Attending: Internal Medicine | Admitting: Internal Medicine

## 2022-07-16 DIAGNOSIS — R918 Other nonspecific abnormal finding of lung field: Secondary | ICD-10-CM

## 2022-07-22 ENCOUNTER — Other Ambulatory Visit (HOSPITAL_COMMUNITY): Payer: Self-pay | Admitting: Internal Medicine

## 2022-07-22 DIAGNOSIS — R911 Solitary pulmonary nodule: Secondary | ICD-10-CM

## 2022-08-08 ENCOUNTER — Other Ambulatory Visit (HOSPITAL_COMMUNITY): Payer: Medicare Other

## 2022-08-15 ENCOUNTER — Encounter (HOSPITAL_COMMUNITY): Payer: Medicare Other

## 2022-08-15 ENCOUNTER — Encounter (HOSPITAL_COMMUNITY): Payer: Self-pay

## 2022-08-26 ENCOUNTER — Encounter: Payer: Self-pay | Admitting: Cardiovascular Disease

## 2022-09-05 ENCOUNTER — Ambulatory Visit (HOSPITAL_COMMUNITY): Payer: Medicare Other

## 2022-09-22 ENCOUNTER — Ambulatory Visit (HOSPITAL_COMMUNITY): Payer: Medicare Other

## 2022-10-03 ENCOUNTER — Ambulatory Visit (HOSPITAL_COMMUNITY)
Admission: RE | Admit: 2022-10-03 | Discharge: 2022-10-03 | Disposition: A | Discharge status: Home / Self Care | Payer: Medicare Other | Source: Ambulatory Visit | Attending: Internal Medicine | Admitting: Internal Medicine

## 2022-10-03 DIAGNOSIS — R911 Solitary pulmonary nodule: Secondary | ICD-10-CM | POA: Diagnosis present

## 2022-10-03 LAB — GLUCOSE, CAPILLARY: Glucose-Capillary: 119 mg/dL — ABNORMAL HIGH (ref 70–99)

## 2022-10-03 MED ORDER — FLUDEOXYGLUCOSE F - 18 (FDG) INJECTION
10.7000 | Freq: Once | INTRAVENOUS | Status: AC | PRN
  Administered 2022-10-03: 10.7 via INTRAVENOUS

## 2022-10-09 ENCOUNTER — Other Ambulatory Visit: Payer: Self-pay | Admitting: Internal Medicine

## 2022-10-09 ENCOUNTER — Encounter: Payer: Self-pay | Admitting: Internal Medicine

## 2022-10-09 DIAGNOSIS — R911 Solitary pulmonary nodule: Secondary | ICD-10-CM

## 2022-10-09 DIAGNOSIS — E041 Nontoxic single thyroid nodule: Secondary | ICD-10-CM

## 2023-01-21 ENCOUNTER — Ambulatory Visit
Admission: RE | Admit: 2023-01-21 | Discharge: 2023-01-21 | Disposition: A | Discharge status: Home / Self Care | Payer: Medicare Other | Source: Ambulatory Visit | Attending: Internal Medicine | Admitting: Internal Medicine

## 2023-01-21 DIAGNOSIS — R911 Solitary pulmonary nodule: Secondary | ICD-10-CM

## 2023-02-19 ENCOUNTER — Ambulatory Visit: Payer: Medicare Other | Admitting: Pulmonary Disease

## 2023-02-19 ENCOUNTER — Encounter: Payer: Self-pay | Admitting: Pulmonary Disease

## 2023-02-19 VITALS — BP 138/72 | HR 72 | Ht 63.0 in | Wt 226.4 lb

## 2023-02-19 DIAGNOSIS — R918 Other nonspecific abnormal finding of lung field: Secondary | ICD-10-CM

## 2023-02-19 DIAGNOSIS — J449 Chronic obstructive pulmonary disease, unspecified: Secondary | ICD-10-CM | POA: Diagnosis not present

## 2023-02-19 NOTE — Patient Instructions (Addendum)
Continue trelegy ellipta 1 puff daily - rinse mouth out after each use  Let us know if you would like an albuterol inhaler to use as needed  We will schedule you for CT Chest scan in 2 months and follow up visit

## 2023-02-19 NOTE — Progress Notes (Signed)
Synopsis: Referred in June 2024 for COPD by Rodrigo Ran, MD  Subjective:   PATIENT ID: Meredith Marshall GENDER: female DOB: 30-Jun-1941, MRN: [id number]  HPI  Chief Complaint  Patient presents with   Consult    Referred by PCP for abnormal CT in May 2024 and PET in January 2024.    Meredith Marshall is an 82 year old woman, former smoker with GERD and seizure disorder who is referred to pulmonary clinic for COPD and pulmonary nodules.   She has been followed in our clinic previously by Dr. Craige Cotta. She was last seen in 2019. Moderate airway obstruction noted on spirometry in 2015.   She is currently taking trelegy 1 puff daily over the past 1 year. She has noticed improvement in her wheezing and dyspnea. She has dyspnea with exertion. No dyspnea with bathing or dressing her self. Some dyspnea walking from lobby to exam room today. No night time awakenings. She does have issues with reflux - improved with protonix use. She is using wedge pillow occasionally.   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 She is not currently on DMARD therapy.   She had coronary CT scan 07/2021 which showed pulmonary nodules and then had dedicated CT Chest scan in 07/2022 followed by PET scan in 10/03/22. The RUL nodules that the PET scan were ordered for resolved and a new RLL subpleural nodule was noted. Follow up CT Chest 01/21/23 showed enlarging RLL subpleural nodule measuring 1.5cm. She had small RLL nodule dating back to 2019 in same location.   She has 30 pack year history and quit smoking in 2010.  Past Medical History:  Diagnosis Date   Aortic atherosclerosis (HCC) 10/09/2021   CT 11/22   Arthritis    Barrett esophagus    Questionable?   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   GERD (gastroesophageal reflux disease)    Hemorrhoids    Hyperlipidemia    Hypertension    Osteoporosis    Palpitations 10/09/2021   Myoview 11/11/2011 No ischemia, EF 84; normal study   Pulmonary nodule 10/09/2021   CT 11/22: 4 mm right middle lobe   Seizure (HCC)    idiopathic nocturnal seizures - last seizure 1988- curroct [facility]15[facility], last sz 1988    Thyroiditis 1989   Tobacco abuse      Family History  Problem Relation Age of Onset   Lung cancer Mother    Cancer Father        larynx   Esophageal cancer Father    Heart disease Sister    Colon cancer Neg Hx    Colon polyps Neg Hx    Rectal cancer Neg Hx    Stomach cancer Neg Hx      Social History   Socioeconomic History   Marital status: Widowed    Spouse name: Not on file   Number of children: 2   Years of education: Not on file   Highest education level: Not on file  Occupational History   Occupation: retired Catering manager for [employer]  Tobacco Use   Smoking status: Former    Packs/day: 1.00    Years: 35.00    Additional pack years: 0.00    Total pack years: 35.00    Types: Cigarettes    Quit date: 11/06/2008    Years since quitting: 14.2   Smokeless tobacco: Never   Tobacco comments:    Quit in 2010  Vaping Use   Vaping Use: Never used  Substance and Sexual Activity   Alcohol use: No    Alcohol/week: 0.0 standard drinks of alcohol   Drug use: No   Sexual activity: Not on file  Other Topics Concern   Not on file  Social History Narrative   [place]   Right Handed   One Story Home   Drinks no caffeine    Social Determinants of Health   Financial Resource Strain: Not on file  Food Insecurity: Not on file  Transportation Needs: Not on file  Physical Activity: Not on file  Stress: Not on file  Social Connections: Not on file   Intimate Partner Violence: Not on file     Allergies  Allergen Reactions   Carbamazepine     REACTION: hives, itching   Elemental Sulfur    Phenobarbital     REACTION: hives, itching   Phenytoin     REACTION: generic - hives/itching.  Pt reports she unable to tolerate the GENERIC but does well with the [facility] name   Ramipril     REACTION: hives, itching   Sulfonamide Derivatives     REACTION: hives, itching     Outpatient Medications Prior to Visit  Medication Sig Dispense Refill   acetaminophen (TYLENOL) 500 MG tablet Take by mouth as needed.       atorvastatin (LIPITOR) 20 MG tablet Take 20 mg by mouth at bedtime.       b complex vitamins tablet Take 1 tablet by mouth daily.     carvedilol (COREG) 6.25 MG tablet Take 3.125 mg by mouth 2 (two) times daily with a meal.      Cholecalciferol (VITAMIN D) 1000 UNITS capsule Take 2,000 Units by mouth.      Fluticasone-Umeclidin-Vilant 100-62.5-25 MCG/ACT AEPB Inhale 1 puff into the lungs daily.     furosemide (LASIX) 20 MG tablet Take 20 mg by mouth daily.     metroNIDAZOLE (METROGEL) 1 % gel Apply 1 application topically daily.     pantoprazole (PROTONIX) 40 MG tablet Take 40 mg by mouth daily.     phenytoin (DILANTIN) 100 MG ER capsule Take 100 mg by mouth 3 (three) times daily.       telmisartan (MICARDIS) 80 MG tablet Take 80 mg by mouth daily.     metoprolol tartrate (LOPRESSOR) 100 MG tablet Take one tablet by mouth 2 hours prior to CT 1 tablet 0   telmisartan (MICARDIS) 40 MG tablet Take 40 mg by mouth daily.       No facility-administered medications prior to visit.    Review of Systems  Constitutional:  Negative for chills, fever, malaise/fatigue and weight loss.  HENT:  Negative for congestion, sinus pain and sore throat.   Eyes: Negative.   Respiratory:  Negative for cough, hemoptysis, sputum production, shortness of breath and wheezing.   Cardiovascular:  Negative for chest pain, palpitations, orthopnea, claudication  and leg swelling.  Gastrointestinal:  Negative for abdominal pain, heartburn, nausea and vomiting.  Genitourinary: Negative.   Musculoskeletal:  Negative for joint pain and myalgias.  Skin:  Negative for rash.  Neurological:  Negative for weakness.  Endo/Heme/Allergies: Negative.   Psychiatric/Behavioral: Negative.        Objective:   Vitals:   02/19/23 1005  BP: 138/72  Pulse: 72  SpO2: 97%  Weight: 226 lb 6.4 oz (102.7 kg)  Height: 5\' 3"  (1.6 m)     Physical Exam Constitutional:      General: She is not in acute distress.    Appearance: She is obese. She is not ill-appearing.  HENT:     Head: Normocephalic and atraumatic.  Eyes:     General: No scleral icterus.    Conjunctiva/sclera: Conjunctivae normal.  Cardiovascular:     Rate and Rhythm: Normal rate and regular rhythm.     Pulses: Normal pulses.     Heart sounds: Normal heart sounds. No murmur heard. Pulmonary:     Effort: Pulmonary effort is normal.     Breath sounds: Normal breath sounds. No wheezing, rhonchi or rales.  Musculoskeletal:     Right lower leg: No edema.     Left lower leg: No edema.  Skin:    General: Skin is warm and dry.  Neurological:     Mental Status: She is alert.    CBC    Component Value Date/Time   WBC 11.8 (H) 05/16/2016 1239   RBC 4.26 05/16/2016 1239   HGB 12.5 05/16/2016 1239   HCT 36.4 05/16/2016 1239   PLT 302.0 05/16/2016 1239   MCV 85.5 05/16/2016 1239   MCHC 34.4 05/16/2016 1239   RDW 12.8 05/16/2016 1239   LYMPHSABS 1.2 05/16/2016 1239   MONOABS 1.4 (H) 05/16/2016 1239   EOSABS 0.5 05/16/2016 1239   BASOSABS 0.0 05/16/2016 1239      Latest Ref Rng & Units 07/09/2021    9:41 AM 05/16/2016   12:39 PM  BMP  Glucose 70 - 99 mg/dL 130  865   BUN 8 - 27 mg/dL 14  13   Creatinine 7.84 - 1.00 mg/dL 6.96  2.95   BUN/Creat Ratio 12 - 28 25    Sodium 134 - 144 mmol/L 134  122   Potassium 3.5 - 5.2 mmol/L 4.5  4.6   Chloride 96 - 106 mmol/L 97  85   CO2 20 - 29  mmol/L 24  33   Calcium 8.7 - 10.3 mg/dL 9.4  9.1    Chest imaging: CT Chest 01/21/23 1. The previously identified pulmonary nodules seen on PET-CT imaging from 10/03/2022 are all resolved, however there are numerous new areas of subpleural nodularity and scattered ground-glass and solid nodules in the right upper, right lower and left lower lobes. Overall, these findings are most consistent with a waxing and waning infectious/inflammatory process with the previously identified findings resolved but new findings indicating relapse/recurrence. Does the patient have clinical signs or symptoms of pneumonia? Consider referral to pulmonology for further evaluation. 2. Small to moderate sliding hiatal hernia. 3. Aortic and coronary artery atherosclerotic vascular calcifications. 4. Numerous additional ancillary findings without significant interval change.   Aortic Atherosclerosis   CT CET Scan 10/03/22 1. Interval resolution of the dominant 16 mm left lower lobe pulmonary nodule with clear decrease in the dominant right lower lobe nodule identified previously. 2. New 11 mm posterior right upper lobe pulmonary nodule with low level FDG accumulation. This is indeterminate and is likely infectious/inflammatory. Follow-up CT chest without contrast in 3 months recommended to ensure resolution. 3. Stable 8 mm right lower lobe pulmonary nodule below size  threshold for PET resolution. Attention on follow-up recommended. 4. No evidence for suspicious hypermetabolic findings in the neck, abdomen, or pelvis. 5. 1.5 cm right thyroid nodule without hypermetabolism. Recommend thyroid US (ref: J Am Coll Radiol. 2015 Feb;12(2): 143-50). 6. Small hiatal hernia. 7.  Aortic Atherosclerosis  PFT:     No data to display          Labs:  Path:  Echo:  Heart Catheterization:    Assessment & Plan:   Pulmonary nodules - Plan: CT Chest Wo Contrast  Chronic obstructive pulmonary disease,  unspecified COPD type (HCC)  Discussion: Meredith Marshall is an 82 year old woman, former smoker with GERD and seizure disorder who is referred to pulmonary clinic for COPD and pulmonary nodules.   She is to continue trelegy ellipta 1 puff daily for COPD.   We will repeat a CT Chest scan August, 3 months after her most recent scan to monitor the RLL subpleural 1.9 x 1.3cm nodule. She has history of waxing and waning nodules, so hopefully this follows suit. If nodules remains or grows will plan on biopsy as next step.   Follow up in 2 months after CT Chest scan.  Melody Comas, MD [facility] Office: [phone]    Current Outpatient Medications:    acetaminophen (TYLENOL) 500 MG tablet, Take by mouth as needed.  , Disp: , Rfl:    atorvastatin (LIPITOR) 20 MG tablet, Take 20 mg by mouth at bedtime.  , Disp: , Rfl:    b complex vitamins tablet, Take 1 tablet by mouth daily., Disp: , Rfl:    carvedilol (COREG) 6.25 MG tablet, Take 3.125 mg by mouth 2 (two) times daily with a meal. , Disp: , Rfl:    Cholecalciferol (VITAMIN D) 1000 UNITS capsule, Take 2,000 Units by mouth. , Disp: , Rfl:    Fluticasone-Umeclidin-Vilant 100-62.5-25 MCG/ACT AEPB, Inhale 1 puff into the lungs daily., Disp: , Rfl:    furosemide (LASIX) 20 MG tablet, Take 20 mg by mouth daily., Disp: , Rfl:    metroNIDAZOLE (METROGEL) 1 % gel, Apply 1 application topically daily., Disp: , Rfl:    pantoprazole (PROTONIX) 40 MG tablet, Take 40 mg by mouth daily., Disp: , Rfl:    phenytoin (DILANTIN) 100 MG ER capsule, Take 100 mg by mouth 3 (three) times daily.  , Disp: , Rfl:    telmisartan (MICARDIS) 80 MG tablet, Take 80 mg by mouth daily., Disp: , Rfl:

## 2023-04-29 ENCOUNTER — Ambulatory Visit (HOSPITAL_COMMUNITY)
Admission: RE | Admit: 2023-04-29 | Discharge: 2023-04-29 | Disposition: A | Discharge status: Home / Self Care | Payer: Medicare Other | Source: Ambulatory Visit | Attending: Pulmonary Disease | Admitting: Pulmonary Disease

## 2023-04-29 DIAGNOSIS — R918 Other nonspecific abnormal finding of lung field: Secondary | ICD-10-CM | POA: Diagnosis present

## 2023-08-12 ENCOUNTER — Ambulatory Visit: Payer: Medicare Other | Admitting: Pulmonary Disease

## 2023-08-12 ENCOUNTER — Encounter: Payer: Self-pay | Admitting: Pulmonary Disease

## 2023-08-12 VITALS — BP 150/62 | HR 78 | Temp 98.0°F | Ht 63.75 in | Wt 224.4 lb

## 2023-08-12 DIAGNOSIS — J449 Chronic obstructive pulmonary disease, unspecified: Secondary | ICD-10-CM | POA: Diagnosis not present

## 2023-08-12 DIAGNOSIS — R918 Other nonspecific abnormal finding of lung field: Secondary | ICD-10-CM | POA: Diagnosis not present

## 2023-08-12 MED ORDER — ALBUTEROL SULFATE HFA 108 (90 BASE) MCG/ACT IN AERS
2.0000 | INHALATION_SPRAY | Freq: Four times a day (QID) | RESPIRATORY_TRACT | 6 refills | Status: AC | PRN
Start: 2023-08-12 — End: ?

## 2023-08-12 NOTE — Progress Notes (Signed)
Synopsis: Referred in June 2024 for COPD by Rodrigo Ran, MD  Subjective:   PATIENT ID: Meredith Marshall GENDER: female DOB: 30-Aug-1941, MRN: [id number]  HPI  Chief Complaint  Patient presents with   Follow-up    Had CT chest 04/2023,Increased sob x 2 mths., cough dry, occass. wheezing   Meredith Marshall is an 82 year old woman, former smoker with GERD and seizure disorder who is referred to pulmonary clinic for COPD and pulmonary nodules.   Initial OV 02/2023 She has been followed in our clinic previously by Dr. Craige Cotta. She was last seen in 2019. Moderate airway obstruction noted on spirometry in 2015.   She is currently taking trelegy 1 puff daily over the past 1 year. She has noticed improvement in her wheezing and dyspnea. She has dyspnea with exertion. No dyspnea with bathing or dressing her self. Some dyspnea walking from lobby to exam room today. No night time awakenings. She does have issues with reflux - improved with protonix use. She is using wedge pillow occasionally.   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 She is not currently on DMARD therapy.   She had coronary CT scan 07/2021 which showed pulmonary nodules and then had dedicated CT Chest scan in 07/2022 followed by PET scan in 10/03/22. The RUL nodules that the PET scan were ordered for resolved and a new RLL subpleural nodule was noted. Follow up CT Chest 01/21/23 showed enlarging RLL subpleural nodule measuring 1.5cm. She had small RLL nodule dating back to 2019 in same location.   She has 30 pack year history and quit smoking in 2010.  Today OV 02/19/23 She presents with increased shortness of breath, cough, and intermittent wheezing over the past couple of months. They report that these symptoms have been gradually increasing. They had a CT scan in August which showed a nodule in their lung that had shrunk, which they attribute to a course of prednisone they had taken for their  rheumatoid arthritis. They express hesitation about taking immune-suppressing drugs for their rheumatoid arthritis due to concerns about their immune system. They currently manage their joint pain with Tylenol. They also report some swelling in their legs, for which they take Lasix about twice a week.   Past Medical History:  Diagnosis Date   Aortic atherosclerosis (HCC) 10/09/2021   CT 11/22   Arthritis    Barrett esophagus    Questionable?   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   GERD (gastroesophageal reflux disease)    Hemorrhoids    Hyperlipidemia    Hypertension    Osteoporosis    Palpitations 10/09/2021   Myoview 11/11/2011 No ischemia, EF 84; normal study   Pulmonary nodule 10/09/2021   CT 11/22: 4 mm right middle lobe   Seizure (HCC)    idiopathic nocturnal seizures - last seizure 1988- curroct [facility] pt [facility]15-18, last sz 1988    Thyroiditis 1989   Tobacco abuse      Family History  Problem Relation Age of Onset   Lung cancer Mother    Cancer  Father        larynx   Esophageal cancer Father    Heart disease Sister    Colon cancer Neg Hx    Colon polyps Neg Hx    Rectal cancer Neg Hx    Stomach cancer Neg Hx      Social History   Socioeconomic History   Marital status: Widowed    Spouse name: Not on file   Number of children: 2   Years of education: Not on file   Highest education level: Not on file  Occupational History   Occupation: retired Catering manager for [employer]  Tobacco Use   Smoking status: Former    Current packs/day: 0.00    Average packs/day: 1 pack/day for 35.0 years (35.0 ttl pk-yrs)    Types: Cigarettes    Start date:  11/06/1973    Quit date: 11/06/2008    Years since quitting: 14.7   Smokeless tobacco: Never   Tobacco comments:    Quit in 2010  Vaping Use   Vaping status: Never Used  Substance and Sexual Activity   Alcohol use: No    Alcohol/week: 0.0 standard drinks of alcohol   Drug use: No   Sexual activity: Not on file  Other Topics Concern   Not on file  Social History Narrative   [place]   Right Handed   One Story Home   Drinks no caffeine    Social Determinants of Health   Financial Resource Strain: Not on file  Food Insecurity: Not on file  Transportation Needs: Not on file  Physical Activity: Not on file  Stress: Not on file  Social Connections: Not on file  Intimate Partner Violence: Not on file     Allergies  Allergen Reactions   Carbamazepine     REACTION: hives, itching   Elemental Sulfur    Phenobarbital     REACTION: hives, itching   Phenytoin     REACTION: generic - hives/itching.  Pt reports she unable to tolerate the GENERIC but does well with the [facility] name   Ramipril     REACTION: hives, itching   Sulfonamide Derivatives     REACTION: hives, itching     Outpatient Medications Prior to Visit  Medication Sig Dispense Refill   acetaminophen (TYLENOL) 500 MG tablet Take by mouth as needed.       atorvastatin (LIPITOR) 20 MG tablet Take 20 mg by mouth at bedtime.       b complex vitamins tablet Take 1 tablet by mouth daily.     carvedilol (COREG) 6.25 MG tablet Take 3.125 mg by mouth 2 (two) times daily with a meal.      Fluticasone-Umeclidin-Vilant 100-62.5-25 MCG/ACT AEPB Inhale 1 puff into the lungs daily.     furosemide (LASIX) 20 MG tablet Take 20 mg by mouth. As needed     metroNIDAZOLE (METROGEL) 1 % gel Apply 1 application topically daily.     pantoprazole (PROTONIX) 40 MG tablet Take 40 mg by mouth daily.     phenytoin (DILANTIN) 100 MG ER capsule Take 100 mg by mouth 3 (three) times daily.       potassium chloride (KLOR-CON M) 10 MEQ  tablet Take 10 mEq by mouth. As needed     telmisartan (MICARDIS) 80 MG tablet Take 80 mg by mouth daily.     Cholecalciferol (VITAMIN D) 1000 UNITS capsule Take 2,000 Units by mouth.      No facility-administered medications prior  to visit.    Review of Systems  Constitutional:  Negative for chills, fever, malaise/fatigue and weight loss.  HENT:  Negative for congestion, sinus pain and sore throat.   Eyes: Negative.   Respiratory:  Negative for cough, hemoptysis, sputum production, shortness of breath and wheezing.   Cardiovascular:  Negative for chest pain, palpitations, orthopnea, claudication and leg swelling.  Gastrointestinal:  Negative for abdominal pain, heartburn, nausea and vomiting.  Genitourinary: Negative.   Musculoskeletal:  Negative for joint pain and myalgias.  Skin:  Negative for rash.  Neurological:  Negative for weakness.  Endo/Heme/Allergies: Negative.   Psychiatric/Behavioral: Negative.        Objective:   Vitals:   08/12/23 1040  BP: (!) 150/62  Pulse: 78  Temp: 98 F (36.7 C)  TempSrc: Temporal  SpO2: 97%  Weight: 224 lb 6.4 oz (101.8 kg)  Height: 5' 3.75" (1.619 m)      Physical Exam Constitutional:      General: She is not in acute distress.    Appearance: She is obese. She is not ill-appearing.  HENT:     Head: Normocephalic and atraumatic.  Eyes:     General: No scleral icterus.    Conjunctiva/sclera: Conjunctivae normal.  Cardiovascular:     Rate and Rhythm: Normal rate and regular rhythm.     Pulses: Normal pulses.     Heart sounds: Normal heart sounds. No murmur heard. Pulmonary:     Effort: Pulmonary effort is normal.     Breath sounds: Normal breath sounds. No wheezing, rhonchi or rales.  Musculoskeletal:     Right lower leg: No edema.     Left lower leg: No edema.  Skin:    General: Skin is warm and dry.  Neurological:     Mental Status: She is alert.    CBC    Component Value Date/Time   WBC 11.8 (H) 05/16/2016 1239    RBC 4.26 05/16/2016 1239   HGB 12.5 05/16/2016 1239   HCT 36.4 05/16/2016 1239   PLT 302.0 05/16/2016 1239   MCV 85.5 05/16/2016 1239   MCHC 34.4 05/16/2016 1239   RDW 12.8 05/16/2016 1239   LYMPHSABS 1.2 05/16/2016 1239   MONOABS 1.4 (H) 05/16/2016 1239   EOSABS 0.5 05/16/2016 1239   BASOSABS 0.0 05/16/2016 1239      Latest Ref Rng & Units 07/09/2021    9:41 AM 05/16/2016   12:39 PM  BMP  Glucose 70 - 99 mg/dL 478  295   BUN 8 - 27 mg/dL 14  13   Creatinine 6.21 - 1.00 mg/dL 3.08  6.57   BUN/Creat Ratio 12 - 28 25    Sodium 134 - 144 mmol/L 134  122   Potassium 3.5 - 5.2 mmol/L 4.5  4.6   Chloride 96 - 106 mmol/L 97  85   CO2 20 - 29 mmol/L 24  33   Calcium 8.7 - 10.3 mg/dL 9.4  9.1    Chest imaging: CT Chest 01/21/23 1. The previously identified pulmonary nodules seen on PET-CT imaging from 10/03/2022 are all resolved, however there are numerous new areas of subpleural nodularity and scattered ground-glass and solid nodules in the right upper, right lower and left lower lobes. Overall, these findings are most consistent with a waxing and waning infectious/inflammatory process with the previously identified findings resolved but new findings indicating relapse/recurrence. Does the patient have clinical signs or symptoms of pneumonia? Consider referral to pulmonology for further evaluation. 2. Small to moderate sliding  hiatal hernia. 3. Aortic and coronary artery atherosclerotic vascular calcifications. 4. Numerous additional ancillary findings without significant interval change.   Aortic Atherosclerosis   CT CET Scan 10/03/22 1. Interval resolution of the dominant 16 mm left lower lobe pulmonary nodule with clear decrease in the dominant right lower lobe nodule identified previously. 2. New 11 mm posterior right upper lobe pulmonary nodule with low level FDG accumulation. This is indeterminate and is likely infectious/inflammatory. Follow-up CT chest without  contrast in 3 months recommended to ensure resolution. 3. Stable 8 mm right lower lobe pulmonary nodule below size threshold for PET resolution. Attention on follow-up recommended. 4. No evidence for suspicious hypermetabolic findings in the neck, abdomen, or pelvis. 5. 1.5 cm right thyroid nodule without hypermetabolism. Recommend thyroid US (ref: J Am Coll Radiol. 2015 Feb;12(2): 143-50). 6. Small hiatal hernia. 7.  Aortic Atherosclerosis  PFT:     No data to display          Labs:  Path:  Echo:  Heart Catheterization:    Assessment & Plan:   Chronic obstructive pulmonary disease, unspecified COPD type (HCC) - Plan: albuterol (VENTOLIN HFA) 108 (90 Base) MCG/ACT inhaler  Discussion: Mikael Dryer is an 82 year old woman, former smoker with GERD and seizure disorder who is referred to pulmonary clinic for COPD and pulmonary nodules.   Chronic Obstructive Pulmonary Disease (COPD) Mild increase in shortness of breath, cough, and intermittent wheezing. No fevers or chills. Currently on Trelegy one puff daily. -Continue Trelegy one puff daily. -Prescribe Albuterol inhaler for as-needed use. -Consider course of prednisone if symptoms worsen.  Pulmonary Nodules Improvement in nodules and thickening on recent CT scan, possibly secondary to rheumatoid arthritis and treatment with prednisone. No current prednisone use. Patient hesitant to start immune-modulating drugs for rheumatoid arthritis. -Continue monitoring with next CT scan in August 2025. -Consider low-dose prednisone therapy if symptoms worsen or nodules increase on future scans.  Rheumatoid Arthritis Joint pains managed with Tylenol. Patient hesitant to start immune-modulating drugs due to concerns about immune suppression. -Continue current management with Tylenol. -Consider discussion with rheumatologist regarding low-dose prednisone therapy or other treatment options if joint pains worsen.  Follow-up in 6 months  or sooner if changes in respiratory symptoms.  Melody Comas, MD [facility] Office: [phone]    Current Outpatient Medications:    acetaminophen (TYLENOL) 500 MG tablet, Take by mouth as needed.  , Disp: , Rfl:    albuterol (VENTOLIN HFA) 108 (90 Base) MCG/ACT inhaler, Inhale 2 puffs into the lungs every 6 (six) hours as needed for wheezing or shortness of breath., Disp: 8.5 g, Rfl: 6   atorvastatin (LIPITOR) 20 MG tablet, Take 20 mg by mouth at bedtime.  , Disp: , Rfl:    b complex vitamins tablet, Take 1 tablet by mouth daily., Disp: , Rfl:    carvedilol (COREG) 6.25 MG tablet, Take 3.125 mg by mouth 2 (two) times daily with a meal. , Disp: , Rfl:    Fluticasone-Umeclidin-Vilant 100-62.5-25 MCG/ACT AEPB, Inhale 1 puff into the lungs daily., Disp: , Rfl:    furosemide (LASIX) 20 MG tablet, Take 20 mg by mouth. As needed, Disp: , Rfl:    metroNIDAZOLE (METROGEL) 1 % gel, Apply 1 application topically daily., Disp: , Rfl:    pantoprazole (PROTONIX) 40 MG tablet, Take 40 mg by mouth daily., Disp: , Rfl:    phenytoin (DILANTIN) 100 MG ER capsule, Take 100 mg by mouth 3 (three) times daily.  ,  Disp: , Rfl:    potassium chloride (KLOR-CON M) 10 MEQ tablet, Take 10 mEq by mouth. As needed, Disp: , Rfl:    telmisartan (MICARDIS) 80 MG tablet, Take 80 mg by mouth daily., Disp: , Rfl:    Cholecalciferol (VITAMIN D) 1000 UNITS capsule, Take 2,000 Units by mouth. , Disp: , Rfl:

## 2023-08-12 NOTE — Patient Instructions (Addendum)
Continue trelegy 1 puff daily - rinse mouth out after each use  Use albuterol inhaler 1-2 puffs every 4-6 hours as needed  Follow up in 6 months, call sooner if needed

## 2024-02-22 ENCOUNTER — Ambulatory Visit: Admitting: Pulmonary Disease

## 2024-02-22 ENCOUNTER — Encounter: Payer: Self-pay | Admitting: Pulmonary Disease

## 2024-02-22 VITALS — BP 154/83 | HR 84 | Ht 63.0 in | Wt 219.0 lb

## 2024-02-22 DIAGNOSIS — R918 Other nonspecific abnormal finding of lung field: Secondary | ICD-10-CM | POA: Diagnosis not present

## 2024-02-22 DIAGNOSIS — Z87891 Personal history of nicotine dependence: Secondary | ICD-10-CM | POA: Diagnosis not present

## 2024-02-22 DIAGNOSIS — J449 Chronic obstructive pulmonary disease, unspecified: Secondary | ICD-10-CM

## 2024-02-22 NOTE — Patient Instructions (Addendum)
 Continue trelegy inhaler 1 puff daily - rinse mouth out after each use  Use albuterol  inhaler 1-2 puffs very 4-6 hours as needed  Continue to monitor the area on your arm from the bug bite  We will schedule you for CT Chest scan in August for follow up of lung nodules  Follow up in 3 months via video to review CT Chest scan

## 2024-02-22 NOTE — Progress Notes (Signed)
 Synopsis: Referred in June 2024 for COPD by Oneil Neth, MD  Subjective:   PATIENT ID: Meredith Marshall GENDER: female DOB: [birth year], MRN: [id number]  HPI  Chief Complaint  Patient presents with   Follow-up   Meredith Marshall is an 83 year old woman, former smoker with GERD and seizure disorder who is referred to pulmonary clinic for COPD and pulmonary nodules.   Initial OV 02/2023 She has been followed in our clinic previously by Dr. Shellia. She was last seen in 2019. Moderate airway obstruction noted on spirometry in 2015.   She is currently taking trelegy 100mcg 1 puff daily over the past 1 year. She has noticed improvement in her wheezing and dyspnea. She has dyspnea with exertion. No dyspnea with bathing or dressing her self. Some dyspnea walking from lobby to exam room today. No night time awakenings. She does have issues with reflux - improved with protonix use. She is using wedge pillow occasionally.   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 She is not currently on DMARD therapy.   She had coronary CT scan 07/2021 which showed pulmonary nodules and then had dedicated CT Chest scan in 07/2022 followed by PET scan in 10/03/22. The RUL nodules that the PET scan were ordered for resolved and a new RLL subpleural nodule was noted. Follow up CT Chest 01/21/23 showed enlarging RLL subpleural nodule measuring 1.5cm. She had small RLL nodule dating back to 2019 in same location.   She has 30 pack year history and quit smoking in 2010.  OV 02/19/23 She presents with increased shortness of breath, cough, and intermittent wheezing over the past couple of months. They report that these symptoms have been gradually increasing. They had a CT scan in August which showed a nodule in their lung that had shrunk, which they attribute to a course of prednisone  they had taken for their rheumatoid arthritis. They express hesitation about taking immune-suppressing drugs  for their rheumatoid arthritis due to concerns about their immune system. They currently manage their joint pain with Tylenol. They also report some swelling in their legs, for which they take Lasix about twice a week.  OV 02/22/24 She has a bug bite for about ten or eleven days, with a persistent red spot that has not changed in size despite using cortisone cream, Neosporin, and ice. There is no itching, pain, warmth, necrosis, fever, or systemic symptoms. She avoids Benadryl due to its sedative effects.  She had COVID-19 in December 2024, which was mild and resolved without significant issues. In January 2025, she experienced GERD with possible aspiration, leading to a four-week period of coughing and mucus production. She manages GERD symptoms by elevating her bed.  She uses Trelegy, one puff daily, for her respiratory condition and has not needed albuterol  recently. She experiences more respiratory difficulty in humid weather but generally feels well and appreciates her mobility and cognitive function.  Past Medical History:  Diagnosis Date   Aortic atherosclerosis (HCC) 10/09/2021   CT 11/22   Arthritis    Barrett esophagus    Questionable?   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   GERD (gastroesophageal reflux disease)    Hemorrhoids    Hyperlipidemia    Hypertension    Osteoporosis    Palpitations 10/09/2021   Myoview  11/11/2011 No ischemia, EF 84; normal study   Pulmonary nodule 10/09/2021   CT 11/22: 4 mm right middle lobe   Seizure (HCC)    idiopathic nocturnal seizures - last seizure 1988- curroct [facility] pt at [facility]15[facility]18, last sz 1988    Thyroiditis 1989   Tobacco  abuse      Family History  Problem Relation Age of Onset   Lung cancer Mother    Cancer Father        larynx   Esophageal cancer Father    Heart disease Sister    Colon cancer Neg Hx    Colon polyps Neg Hx    Rectal cancer Neg Hx    Stomach cancer Neg Hx      Social History   Socioeconomic History   Marital status: Widowed    Spouse name: Not on file   Number of children: 2   Years of education: Not on file   Highest education level: Not on file  Occupational History   Occupation: retired Catering manager for [employer]  Tobacco Use   Smoking status: Former    Current packs/day: 0.00    Average packs/day: 1 pack/day for 35.0 years (35.0 ttl pk-yrs)    Types: Cigarettes    Start date: 11/06/1973    Quit date: 11/06/2008    Years since quitting: 15.3   Smokeless tobacco: Never   Tobacco comments:    Quit in 2010  Vaping Use   Vaping status: Never Used  Substance and Sexual Activity   Alcohol use: No    Alcohol/week: 0.0 standard drinks of alcohol   Drug use: No   Sexual activity: Not on file  Other Topics Concern   Not on file  Social History Narrative   [place]   Right Handed   [employer] Home   Drinks no caffeine    Social Drivers of Corporate investment banker Strain: Not on file  Food Insecurity: Not on file  Transportation Needs: Not on file  Physical Activity: Not on file  Stress: Not on file  Social Connections: Not on file  Intimate Partner Violence: Not on file     Allergies  Allergen Reactions   Carbamazepine     REACTION: hives, itching   Elemental Sulfur    Phenobarbital     REACTION: hives, itching   Phenytoin     REACTION: generic - hives/itching.  Pt reports she unable to tolerate the GENERIC but does well with the [facility] name   Ramipril     REACTION: hives, itching   Sulfonamide Derivatives     REACTION: hives, itching     Outpatient Medications Prior to Visit  Medication Sig Dispense Refill   acetaminophen  (TYLENOL) 500 MG tablet Take by mouth as needed.       albuterol  (VENTOLIN  HFA) 108 (90 Base) MCG/ACT inhaler Inhale 2 puffs into the lungs every 6 (six) hours as needed for wheezing or shortness of breath. 8.5 g 6   atorvastatin (LIPITOR) 20 MG tablet Take 20 mg by mouth at bedtime.       b complex vitamins tablet Take 1 tablet by mouth daily.     carvedilol (COREG) 6.25 MG tablet Take 3.125 mg by mouth 2 (two) times  daily with a meal.      Fluticasone-Umeclidin-Vilant 100-62.5-25 MCG/ACT AEPB Inhale 1 puff into the lungs daily.     furosemide (LASIX) 20 MG tablet Take 20 mg by mouth. As needed     metroNIDAZOLE (METROGEL) 1 % gel Apply 1 application topically daily.     pantoprazole (PROTONIX) 40 MG tablet Take 40 mg by mouth daily.     phenytoin (DILANTIN) 100 MG ER capsule Take 100 mg by mouth 3 (three) times daily.       potassium chloride (KLOR-CON M) 10 MEQ tablet Take 10 mEq by mouth. As needed     telmisartan (MICARDIS) 80 MG tablet Take 80 mg by mouth daily.     No facility-administered medications prior to visit.   Review of Systems  Constitutional:  Negative for chills, fever, malaise/fatigue and weight loss.  HENT:  Negative for congestion, sinus pain and sore throat.   Eyes: Negative.   Respiratory:  Negative for cough, hemoptysis, sputum production, shortness of breath and wheezing.   Cardiovascular:  Negative for chest pain, palpitations, orthopnea, claudication and leg swelling.  Gastrointestinal:  Negative for abdominal pain, heartburn, nausea and vomiting.  Genitourinary: Negative.   Musculoskeletal:  Negative for joint pain and myalgias.  Skin:  Positive for rash.  Neurological:  Negative for weakness.  Endo/Heme/Allergies: Negative.   Psychiatric/Behavioral: Negative.     Objective:   Vitals:   02/22/24 0936  BP: (!) 154/83  Pulse: 84  SpO2: 96%  Weight: 219 lb (99.3 kg)  Height: 5' 3 (1.6 m)       Physical Exam Constitutional:      General: She is  not in acute distress.    Appearance: She is obese. She is not ill-appearing.  HENT:     Head: Normocephalic and atraumatic.   Eyes:     General: No scleral icterus.    Conjunctiva/sclera: Conjunctivae normal.    Cardiovascular:     Rate and Rhythm: Normal rate and regular rhythm.     Pulses: Normal pulses.     Heart sounds: Normal heart sounds. No murmur heard. Pulmonary:     Effort: Pulmonary effort is normal.     Breath sounds: Normal breath sounds. No wheezing, rhonchi or rales.   Musculoskeletal:     Right lower leg: No edema.     Left lower leg: No edema.   Skin:    General: Skin is warm and dry.     Findings: Erythema (left forearm. no warmth or induration) present.   Neurological:     Mental Status: She is alert.    CBC    Component Value Date/Time   WBC 11.8 (H) 05/16/2016 1239   RBC 4.26 05/16/2016 1239   HGB 12.5 05/16/2016 1239   HCT 36.4 05/16/2016 1239   PLT 302.0 05/16/2016 1239   MCV 85.5 05/16/2016 1239   MCHC 34.4 05/16/2016 1239   RDW 12.8 05/16/2016 1239   LYMPHSABS 1.2 05/16/2016 1239   MONOABS 1.4 (H) 05/16/2016 1239   EOSABS 0.5 05/16/2016 1239   BASOSABS 0.0 05/16/2016 1239      Latest Ref Rng & Units 07/09/2021    9:41 AM 05/16/2016   12:39 PM  BMP  Glucose 70 - 99 mg/dL 896  884   BUN 8 - 27 mg/dL 14  13   Creatinine 9.42 - 1.00 mg/dL 9.44  9.39   BUN/Creat Ratio 12 - 28 25    Sodium 134 - 144 mmol/L 134  122  Potassium 3.5 - 5.2 mmol/L 4.5  4.6   Chloride 96 - 106 mmol/L 97  85   CO2 20 - 29 mmol/L 24  33   Calcium 8.7 - 10.3 mg/dL 9.4  9.1    Chest imaging: CT Chest 01/21/23 1. The previously identified pulmonary nodules seen on PET-CT imaging from 10/03/2022 are all resolved, however there are numerous new areas of subpleural nodularity and scattered ground-glass and solid nodules in the right upper, right lower and left lower lobes. Overall, these findings are most consistent with a waxing and  waning infectious/inflammatory process with the previously identified findings resolved but new findings indicating relapse/recurrence. Does the patient have clinical signs or symptoms of pneumonia? Consider referral to pulmonology for further evaluation. 2. Small to moderate sliding hiatal hernia. 3. Aortic and coronary artery atherosclerotic vascular calcifications. 4. Numerous additional ancillary findings without significant interval change.   Aortic Atherosclerosis   CT CET Scan 10/03/22 1. Interval resolution of the dominant 16 mm left lower lobe pulmonary nodule with clear decrease in the dominant right lower lobe nodule identified previously. 2. New 11 mm posterior right upper lobe pulmonary nodule with low level FDG accumulation. This is indeterminate and is likely infectious/inflammatory. Follow-up CT chest without contrast in 3 months recommended to ensure resolution. 3. Stable 8 mm right lower lobe pulmonary nodule below size threshold for PET resolution. Attention on follow-up recommended. 4. No evidence for suspicious hypermetabolic findings in the neck, abdomen, or pelvis. 5. 1.5 cm right thyroid nodule without hypermetabolism. Recommend thyroid US  (ref: J Am Coll Radiol. 2015 Feb;12(2): 143-50). 6. Small hiatal hernia. 7.  Aortic Atherosclerosis  PFT:     No data to display          Labs:  Path:  Echo:  Heart Catheterization:    Assessment & Plan:   Pulmonary nodules - Plan: CT Chest Wo Contrast  Discussion: Meredith Marshall is an 83 year old woman, former smoker with GERD and seizure disorder who is referred to pulmonary clinic for COPD and pulmonary nodules.   Chronic Obstructive Pulmonary Disease (COPD) -Continue Trelegy one puff daily. -Albuterol  inhaler for as-needed use.  Pulmonary Nodules -Continue monitoring with next CT scan in August 2025.  Follow-up in 3 months via video visit to review CT Chest scan results  Dorn Chill,  MD [facility] Office: [phone]    Current Outpatient Medications:    acetaminophen (TYLENOL) 500 MG tablet, Take by mouth as needed.  , Disp: , Rfl:    albuterol  (VENTOLIN  HFA) 108 (90 Base) MCG/ACT inhaler, Inhale 2 puffs into the lungs every 6 (six) hours as needed for wheezing or shortness of breath., Disp: 8.5 g, Rfl: 6   atorvastatin (LIPITOR) 20 MG tablet, Take 20 mg by mouth at bedtime.  , Disp: , Rfl:    b complex vitamins tablet, Take 1 tablet by mouth daily., Disp: , Rfl:    carvedilol (COREG) 6.25 MG tablet, Take 3.125 mg by mouth 2 (two) times daily with a meal. , Disp: , Rfl:    Fluticasone-Umeclidin-Vilant 100-62.5-25 MCG/ACT AEPB, Inhale 1 puff into the lungs daily., Disp: , Rfl:    furosemide (LASIX) 20 MG tablet, Take 20 mg by mouth. As needed, Disp: , Rfl:    metroNIDAZOLE (METROGEL) 1 % gel, Apply 1 application topically daily., Disp: , Rfl:    pantoprazole (PROTONIX) 40 MG tablet, Take 40 mg by mouth daily., Disp: , Rfl:    phenytoin (DILANTIN) 100 MG ER capsule,  Take 100 mg by mouth 3 (three) times daily.  , Disp: , Rfl:    potassium chloride (KLOR-CON M) 10 MEQ tablet, Take 10 mEq by mouth. As needed, Disp: , Rfl:    telmisartan (MICARDIS) 80 MG tablet, Take 80 mg by mouth daily., Disp: , Rfl:

## 2024-02-28 ENCOUNTER — Encounter: Payer: Self-pay | Admitting: Pulmonary Disease

## 2024-03-31 ENCOUNTER — Emergency Department (HOSPITAL_COMMUNITY)

## 2024-03-31 ENCOUNTER — Emergency Department (HOSPITAL_COMMUNITY)
Admission: EM | Admit: 2024-03-31 | Discharge: 2024-03-31 | Disposition: A | Discharge status: Home / Self Care | Attending: Emergency Medicine | Admitting: Emergency Medicine

## 2024-03-31 ENCOUNTER — Other Ambulatory Visit: Payer: Self-pay

## 2024-03-31 DIAGNOSIS — M25551 Pain in right hip: Secondary | ICD-10-CM | POA: Diagnosis not present

## 2024-03-31 DIAGNOSIS — Z79899 Other long term (current) drug therapy: Secondary | ICD-10-CM | POA: Insufficient documentation

## 2024-03-31 DIAGNOSIS — S4291XA Fracture of right shoulder girdle, part unspecified, initial encounter for closed fracture: Secondary | ICD-10-CM

## 2024-03-31 DIAGNOSIS — I10 Essential (primary) hypertension: Secondary | ICD-10-CM | POA: Insufficient documentation

## 2024-03-31 DIAGNOSIS — W010XXA Fall on same level from slipping, tripping and stumbling without subsequent striking against object, initial encounter: Secondary | ICD-10-CM | POA: Insufficient documentation

## 2024-03-31 DIAGNOSIS — S42254A Nondisplaced fracture of greater tuberosity of right humerus, initial encounter for closed fracture: Secondary | ICD-10-CM | POA: Insufficient documentation

## 2024-03-31 DIAGNOSIS — S4991XA Unspecified injury of right shoulder and upper arm, initial encounter: Secondary | ICD-10-CM | POA: Diagnosis present

## 2024-03-31 MED ORDER — CARVEDILOL 12.5 MG PO TABS
12.5000 mg | ORAL_TABLET | Freq: Two times a day (BID) | ORAL | Status: DC
  Administered 2024-03-31: 12.5 mg via ORAL
  Filled 2024-03-31: qty 1

## 2024-03-31 MED ORDER — OXYCODONE-ACETAMINOPHEN 5-325 MG PO TABS: 1.0000 | ORAL_TABLET | Freq: Four times a day (QID) | ORAL | 0 refills | Status: AC | PRN

## 2024-03-31 MED ORDER — OXYCODONE-ACETAMINOPHEN 5-325 MG PO TABS
1.0000 | ORAL_TABLET | Freq: Once | ORAL | Status: AC
  Administered 2024-03-31: 1 via ORAL
  Filled 2024-03-31: qty 1

## 2024-03-31 NOTE — Discharge Instructions (Signed)
 1.  You have broken your shoulder.  This is a common injury with falls.  At this time the management is to wear your sling for comfort and take pain medication.  You will need to follow-up with orthopedics. 2.  You may apply well wrapped ice pack over the area of swelling of pain. 3.  You may take 1 Percocet tablet every 6 hours.  Along with this, you may take 1 extra strength Tylenol  tablet.  If you have any tendency to get constipated, take a stool softener such as Colace.  Percocet can make you constipated. 4.  Reviewing your medical records, your doctor added a another blood pressure medication called carvedilol  (Coreg ).  When we talked you said you were taking the telmisartan but not an additional blood pressure medication.  Make sure you are taking the carvedilol  which is a twice daily medication.  Call your doctor to assist in monitoring your blood pressure and making sure it is responding appropriately to your medications.

## 2024-03-31 NOTE — ED Provider Notes (Signed)
 [place] EMERGENCY DEPARTMENT AT [facility] Provider Note   CSN: [id number] Arrival date & time: 03/31/24  9779     Patient presents with: Shoulder Pain   Meredith Marshall is a 83 y.o. female.   HPI Patient reports she went into her kitchen to get something in the early hours of the morning.  She reports she had on her sleeping socks which were slippery.  Her feet went out from under her and she fell landing on the right shoulder.  Patient denies that she hit her head.  She denies any headache or neck pain.  No loss of consciousness.  Most of her pain is in the right shoulder.  It is painful with any movement.  She denies chest pain or shortness of breath.  She reports she has some pain at the right hip but was able to get up and can walk without difficulty.  Patient does not take any anticoagulants.  Patient's blood pressure is notably high.  Patient reports that she was aware of this over the past few weeks.  She was at the dentist this week and her systolic pressure was 190.  Patient reports she is compliant with her telmisartan.    Prior to Admission medications   Medication Sig Start Date End Date Taking? Authorizing Provider  oxyCODONE -acetaminophen  (PERCOCET/ROXICET) 5-325 MG tablet Take 1 tablet by mouth every 6 (six) hours as needed for severe pain (pain score 7-10). 03/31/24  Yes Neosha Switalski, Ludivina, MD  acetaminophen  (TYLENOL ) 500 MG tablet Take by mouth as needed.      [provider]  albuterol  (VENTOLIN  HFA) 108 (90 Base) MCG/ACT inhaler Inhale 2 puffs into the lungs every 6 (six) hours as needed for wheezing or shortness of breath. 08/12/23   Kara Dorn NOVAK, MD  atorvastatin (LIPITOR) 20 MG tablet Take 20 mg by mouth at bedtime.      [provider]  b complex vitamins tablet Take 1 tablet by mouth daily.    [provider]  carvedilol  (COREG ) 6.25 MG tablet Take 3.125 mg by mouth 2 (two) times daily with a meal.     [provider]  Fluticasone-Umeclidin-Vilant 100-62.5-25 MCG/ACT AEPB Inhale 1 puff into the lungs daily. 06/11/21   [provider]  furosemide (LASIX) 20 MG tablet Take 20 mg by mouth. As needed    [provider]  metroNIDAZOLE (METROGEL) 1 % gel Apply 1 application topically daily. 06/05/21   [provider]  pantoprazole (PROTONIX) 40 MG tablet Take 40 mg by mouth daily.    [provider]  phenytoin (DILANTIN) 100 MG ER capsule Take 100 mg by mouth 3 (three) times daily.      [provider]  potassium chloride (KLOR-CON M) 10 MEQ tablet Take 10 mEq by mouth. As needed    [provider]  telmisartan (MICARDIS) 80 MG tablet Take 80 mg by mouth daily.    [provider]  DEXILANT 60 MG capsule Take 60 mg by mouth daily as needed.  01/28/12 03/06/14  [provider]    Allergies: Carbamazepine, Elemental sulfur, Phenobarbital, Phenytoin, Ramipril, and Sulfonamide derivatives    Review of Systems  Updated Vital Signs BP (!) 180/73 (BP Location: Left Arm)   Pulse 69   Temp 98.4 F (36.9 C)   Resp 18   SpO2 98%   Physical Exam Constitutional:      Comments: Alert nontoxic well in appearance.  Clear mental status.  No respiratory distress.  HENT:     Head: Normocephalic and atraumatic.     Mouth/Throat:     Pharynx: Oropharynx is clear.  Eyes:     Extraocular Movements: Extraocular movements intact.  Cardiovascular:     Rate and Rhythm: Normal rate and regular rhythm.  Pulmonary:     Effort: Pulmonary effort is normal.     Breath sounds: Normal breath sounds.  Chest:     Chest wall: No tenderness.  Abdominal:     General: There is no distension.     Palpations: Abdomen is soft.     Tenderness: There is no abdominal tenderness.  Musculoskeletal:     Comments: No significant visible deformity of the right shoulder.  Patient has pain with any type of range of motion at the shoulder.  Reproducible pain over the point to  the shoulder.  No swelling or deformity at the elbow wrist or hand.  Hand is warm and dry.  Radial pulse 2+.  Patient has intact grip strength.  Bilateral lower extremities no contusions or abrasions to the knees.  No deformities at the hip knee or ankle.  Symmetric.  Skin:    General: Skin is warm and dry.  Neurological:     General: No focal deficit present.     Mental Status: She is oriented to person, place, and time.     Motor: No weakness.     Coordination: Coordination normal.     Comments: Patient is alert.  Speech is clear.  Cognitive function is normal with alert and intact situational conversation.  Patient can follow commands without difficulty.  Psychiatric:        Mood and Affect: Mood normal.     (all labs ordered are listed, but only abnormal results are displayed) Labs Reviewed - No data to display  EKG: None  Radiology: DG Shoulder Right Result Date: 03/31/2024 CLINICAL DATA:  Fall with right shoulder pain.  Fall EXAM: RIGHT SHOULDER - 2+ VIEW COMPARISON:  None. FINDINGS: Two views are provided. There is a comminuted nondisplaced fracture of greater tuberosity. There is no further evidence of fractures. The glenohumeral joint is unremarkable, with spurring and narrowing noted at the [facility] joint. On the [place]-view, there are clustered small rounded calcifications along the posterior cuff insertion consistent with calcific tendinopathy. The bones are mildly demineralized. IMPRESSION: 1. Comminuted fracture of the greater tuberosity, no significant displacement. 2. 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 3. DJD AC joint. Electronically Signed   By: Francis Quam M.D.   On: 03/31/2024 07:23     Procedures   Medications Ordered in the ED  carvedilol  (COREG ) tablet 12.5 mg (12.5 mg Oral Given 03/31/24 0744)  oxyCODONE -acetaminophen  (PERCOCET/ROXICET) 5-325 MG per tablet 1 tablet (1 tablet Oral Given 03/31/24 0744)                                     Medical Decision Making Risk Prescription drug management.  Patient had a mechanical fall in her house slipping on the floor.  Only area of significant pain is right shoulder.  Right shoulder x-rays obtained through triage.  I personally due to the shoulder x-ray.  Greater tuberosity fracture present.  Radiology review is for comminuted fracture but nondisplaced.  No other injury identified.  Patient is otherwise well appearance.  Incidentally patient significantly hypertensive.  We discussed this.  She does not have  any signs of endorgan damage.  Patient reports she is compliant with her telmisartan.  I have reviewed prescribing record.  Patient has a prescription that was started in July 1 from her PCP for Coreg .  Will administer 1 dose of Coreg  in the emergency department and have reviewed close follow-up with PCP for blood pressure monitoring.  She voices understanding and intends to contact her PCP to clarify medications and monitoring of hypertension.      Final diagnoses:  Closed fracture of right shoulder, initial encounter  Uncontrolled hypertension    ED Discharge Orders          Ordered    oxyCODONE -acetaminophen  (PERCOCET/ROXICET) 5-325 MG tablet  Every 6 hours PRN        03/31/24 0750               Armenta Canning, MD 03/31/24 [phone]

## 2024-03-31 NOTE — ED Triage Notes (Signed)
 Patient c/o falling in her kitchen around 0000. Patient c/o right arm pain. Patient able to moved her fingers and lift up arm. No other complaints at this time.

## 2024-04-19 ENCOUNTER — Telehealth: Payer: Self-pay

## 2024-04-19 NOTE — Telephone Encounter (Signed)
 Copied from CRM [id number]. Topic: General - Other >> Apr 19, 2024 11:06 AM Meredith Marshall wrote: Reason for CRM: Pt has a CT chest wo contrast scheduled for 8/20 at 1130am at the [facility], but is healing form a broken shoulder on her right arm (3 weeks of healing so far). She would like to know if she needs to reschedule or not as she's unsure if she would need to move or lift her shoulder at all for this appt.  Please confirm and call the pt back at [phone] ok to leave a vm.

## 2024-04-20 NOTE — Telephone Encounter (Signed)
 I called and spoke with the pt  I advised best that she call radiology and see what they think This way, if she is unable to position herself properly for the scan at this time, they can go ahead and reschedule it  I have given her the number to [facility] imaging  Nothing further needed

## 2024-04-27 ENCOUNTER — Ambulatory Visit (HOSPITAL_BASED_OUTPATIENT_CLINIC_OR_DEPARTMENT_OTHER)
Admission: RE | Admit: 2024-04-27 | Discharge: 2024-04-27 | Disposition: A | Discharge status: Home / Self Care | Source: Ambulatory Visit | Attending: Pulmonary Disease | Admitting: Pulmonary Disease

## 2024-04-27 DIAGNOSIS — R918 Other nonspecific abnormal finding of lung field: Secondary | ICD-10-CM | POA: Diagnosis present

## 2024-05-28 ENCOUNTER — Ambulatory Visit: Payer: Self-pay | Admitting: Pulmonary Disease

## 2024-06-09 ENCOUNTER — Encounter: Payer: Self-pay | Admitting: Pulmonary Disease

## 2024-06-09 ENCOUNTER — Telehealth: Admitting: Pulmonary Disease

## 2024-06-09 DIAGNOSIS — J449 Chronic obstructive pulmonary disease, unspecified: Secondary | ICD-10-CM | POA: Diagnosis not present

## 2024-06-09 DIAGNOSIS — R918 Other nonspecific abnormal finding of lung field: Secondary | ICD-10-CM | POA: Diagnosis not present

## 2024-06-09 NOTE — Progress Notes (Signed)
 Virtual Visit via Video Note  I connected with Meredith Marshall on 06/09/24 at  8:30 AM EDT by a video enabled telemedicine application and verified that I am speaking with the correct person using two identifiers.  Location: Patient: home  Provider: office   I discussed the limitations of evaluation and management by telemedicine and the availability of in person appointments. The patient expressed understanding and agreed to proceed.  History of Present Illness: Meredith Marshall is an 83 year old woman, former smoker with GERD and seizure disorder who is referred to pulmonary clinic for COPD and pulmonary nodules.   She has been doing well since last visit. Remains on trelegy ellipta 1 puff daily.  Reviewed CT Chest scan results from 04/2024. Her lung nodules remain stable. Plan to repeat CT Chest next year.   Observations/Objective: Elderly woman, no distress Speaking in full sentences  Assessment and Plan: COPD Lung Nodules  - continue trelegy ellipta 1 puff daily - continue albuterol  as needed - repeat CT Chest scan in August 2026, if stable no further imaging required.  Follow Up Instructions: Follow up in August 2026 after CT Chest scan   I discussed the assessment and treatment plan with the patient. The patient was provided an opportunity to ask questions and all were answered. The patient agreed with the plan and demonstrated an understanding of the instructions.   The patient was advised to call back or seek an in-person evaluation if the symptoms worsen or if the condition fails to improve as anticipated.  I provided 20 minutes of non-face-to-face time during this encounter.   Dorn KATHEE Chill, MD

## 2024-06-09 NOTE — Patient Instructions (Addendum)
 Your CT Chest scan is stable, we will repeat a CT Chest scan next year  Continue trelegy 1 puff daily  Continue albuterol  inhaler as needed  Follow up in August of 2026 after CT Chest scan

## 2024-08-25 ENCOUNTER — Other Ambulatory Visit: Payer: Self-pay | Admitting: Pulmonary Disease

## 2024-08-25 DIAGNOSIS — J449 Chronic obstructive pulmonary disease, unspecified: Secondary | ICD-10-CM
